# Patient Record
Sex: Male | Born: 1980 | Race: White | Hispanic: No | Marital: Married | Smoking: Never smoker
Health system: Southern US, Community
[De-identification: ages and names within clinical notes are randomized; demographics above are authoritative.]

## PROBLEM LIST (undated history)

## (undated) DIAGNOSIS — I10 Essential (primary) hypertension: Secondary | ICD-10-CM

## (undated) DIAGNOSIS — M5126 Other intervertebral disc displacement, lumbar region: Secondary | ICD-10-CM

## (undated) DIAGNOSIS — L988 Other specified disorders of the skin and subcutaneous tissue: Secondary | ICD-10-CM

## (undated) DIAGNOSIS — E119 Type 2 diabetes mellitus without complications: Secondary | ICD-10-CM

## (undated) DIAGNOSIS — Z973 Presence of spectacles and contact lenses: Secondary | ICD-10-CM

## (undated) DIAGNOSIS — M5430 Sciatica, unspecified side: Secondary | ICD-10-CM

## (undated) DIAGNOSIS — L409 Psoriasis, unspecified: Secondary | ICD-10-CM

## (undated) HISTORY — PX: WISDOM TOOTH EXTRACTION: SHX21

## (undated) HISTORY — PX: ABCESS DRAINAGE: SHX399

---

## 2014-02-10 ENCOUNTER — Ambulatory Visit (INDEPENDENT_AMBULATORY_CARE_PROVIDER_SITE_OTHER): Payer: 59 | Admitting: Physician Assistant

## 2014-02-10 VITALS — BP 130/90 | HR 110 | Temp 99.1°F | Resp 16 | Ht 75.0 in | Wt 316.0 lb

## 2014-02-10 DIAGNOSIS — J029 Acute pharyngitis, unspecified: Secondary | ICD-10-CM

## 2014-02-10 DIAGNOSIS — H938X3 Other specified disorders of ear, bilateral: Secondary | ICD-10-CM

## 2014-02-10 LAB — POCT RAPID STREP A (OFFICE): Rapid Strep A Screen: NEGATIVE

## 2014-02-10 MED ORDER — FLUTICASONE PROPIONATE 50 MCG/ACT NA SUSP: 2.0000 | Freq: Every day | NASAL | Status: DC

## 2014-02-10 NOTE — Progress Notes (Signed)
   Subjective:    Patient ID: Kyle [name], male    DOB: 11/29/1980, 34 y.o.   MRN: [id number]  PCP: No PCP Per Patient  Medications, allergies, past medical history, surgical history, family history, social history and problem list reviewed and updated.  HPI  [age] yom with no significant pmh presents with 3 day h/o sore throat.   Sx initially were sore throat. This has been constant past 3 days. No dysphagia, odynophagia. Has been taking care of one his children who has strep throat. Pt has had strep throat several times in the past and states this feels similar to those episodes. For past 2 days has felt congested. Ears and head feel congested.   Denies any cp, sob, cough, otalgia, abd pain, n/v, diarrhea. Denies itchy/watery eyes. Denies rhinorrhea, sneezing. No fever, chills at home.   Hx seasonal allergies which have been bugging him lately. Typically takes sudafed prn.   Review of Systems See HPI.     Objective:   Physical Exam  Constitutional: He appears well-developed and well-nourished.  Non-toxic appearance. He does not have a sickly appearance. He does not appear ill. No distress.  BP 130/90 mmHg  Pulse 110  Temp(Src) 99.1 F (37.3 C) (Oral)  Resp 16  Ht 6\' 3"  (1.905 m)  Wt 316 lb (143.337 kg)  BMI 39.50 kg/m2  SpO2 97%   HENT:  Right Ear: Tympanic membrane is not erythematous. A middle ear effusion is present.  Left Ear: Tympanic membrane is not erythematous. A middle ear effusion is present.  Nose: Mucosal edema and rhinorrhea present. Right sinus exhibits no maxillary sinus tenderness and no frontal sinus tenderness. Left sinus exhibits no maxillary sinus tenderness and no frontal sinus tenderness.  Mouth/Throat: Uvula is midline. Posterior oropharyngeal erythema present. No oropharyngeal exudate, posterior oropharyngeal edema or tonsillar abscesses.  Mild erythema posterior oropharynx. No exudates.   Pulmonary/Chest: Effort normal and breath sounds normal. He has  no decreased breath sounds. He has no wheezes. He has no rhonchi. He has no rales.  Lymphadenopathy:       Head (right side): Submandibular adenopathy present. No submental and no tonsillar adenopathy present.       Head (left side): Submandibular adenopathy present. No submental and no tonsillar adenopathy present.    He has no cervical adenopathy.   Results for orders placed or performed in visit on 02/10/14  POCT rapid strep A  Result Value Ref Range   Rapid Strep A Screen Negative Negative       Assessment & Plan:   [age] yom with no significant pmh presents with 3 day h/o sore throat.   Sore throat - Plan: POCT rapid strep A, Culture, Group A Strep --strep swab neg, cx sent --mild erythema on exam, no exudates --hr slightly elevated today, unsure of baseline, no fever today --tylenol/lozenges/fluid/rest --rtc 3-4 days if not improving  Ear congestion, bilateral --could be due to allergies --flonase sent, rec claritin  Donnajean Lopesodd M. Doreene Forrey, PA-C Physician Assistant-Certified [facility]  02/10/2014 9:02 AM

## 2014-02-10 NOTE — Patient Instructions (Signed)
Your strep swab was negative. I sent a culture and will be in touch with you if this is positive to send in an antibiotic.  In the meantime, rest, fluids, tylenol will help.  I've sent in flonase if you feel like your allergies are bothering you and that you're congested.  If youre not feeling better in 3-4 days please return for further evaluation.

## 2014-02-13 LAB — CULTURE, GROUP A STREP

## 2014-02-14 ENCOUNTER — Telehealth: Payer: Self-pay | Admitting: Physician Assistant

## 2014-02-14 DIAGNOSIS — J020 Streptococcal pharyngitis: Secondary | ICD-10-CM

## 2014-02-14 MED ORDER — CEFDINIR 300 MG PO CAPS: 300.0000 mg | ORAL_CAPSULE | Freq: Two times a day (BID) | ORAL | Status: DC

## 2014-02-14 NOTE — Telephone Encounter (Signed)
Throat cx came back for GAS. Called pt, he is still having uri sx along with mild-mod sore throat. Wishes to start abx. States he has taken amoxicillin in the past and felt it did not work for him. He would like a diff abx. Starting cefdinir.

## 2014-11-20 ENCOUNTER — Emergency Department (INDEPENDENT_AMBULATORY_CARE_PROVIDER_SITE_OTHER)
Admission: EM | Admit: 2014-11-20 | Discharge: 2014-11-20 | Disposition: A | Discharge status: Other Acute Inpatient Hospital | Payer: 59 | Source: Home / Self Care

## 2014-11-20 ENCOUNTER — Encounter (HOSPITAL_COMMUNITY): Payer: Self-pay | Admitting: *Deleted

## 2014-11-20 ENCOUNTER — Emergency Department (HOSPITAL_COMMUNITY)
Admission: EM | Admit: 2014-11-20 | Discharge: 2014-11-20 | Disposition: A | Discharge status: Home / Self Care | Payer: 59 | Attending: Emergency Medicine | Admitting: Emergency Medicine

## 2014-11-20 ENCOUNTER — Encounter (HOSPITAL_COMMUNITY): Payer: Self-pay | Admitting: Emergency Medicine

## 2014-11-20 DIAGNOSIS — L0501 Pilonidal cyst with abscess: Secondary | ICD-10-CM | POA: Diagnosis not present

## 2014-11-20 DIAGNOSIS — Z7951 Long term (current) use of inhaled steroids: Secondary | ICD-10-CM | POA: Insufficient documentation

## 2014-11-20 DIAGNOSIS — Z792 Long term (current) use of antibiotics: Secondary | ICD-10-CM | POA: Insufficient documentation

## 2014-11-20 MED ORDER — HYDROCODONE-ACETAMINOPHEN 5-325 MG PO TABS: ORAL_TABLET | ORAL | Status: DC

## 2014-11-20 MED ORDER — BUPIVACAINE HCL (PF) 0.5 % IJ SOLN
10.0000 mL | Freq: Once | INTRAMUSCULAR | Status: AC
  Administered 2014-11-20: 10 mL
  Filled 2014-11-20: qty 10

## 2014-11-20 MED ORDER — LIDOCAINE-EPINEPHRINE-TETRACAINE (LET) SOLUTION
3.0000 mL | Freq: Once | NASAL | Status: AC
  Administered 2014-11-20: 3 mL via TOPICAL
  Filled 2014-11-20: qty 3

## 2014-11-20 MED ORDER — SULFAMETHOXAZOLE-TRIMETHOPRIM 800-160 MG PO TABS: 1.0000 | ORAL_TABLET | Freq: Two times a day (BID) | ORAL | Status: DC

## 2014-11-20 MED ORDER — SULFAMETHOXAZOLE-TRIMETHOPRIM 800-160 MG PO TABS
1.0000 | ORAL_TABLET | Freq: Once | ORAL | Status: AC
  Administered 2014-11-20: 1 via ORAL
  Filled 2014-11-20: qty 1

## 2014-11-20 NOTE — Discharge Instructions (Signed)
Take vicodin for breakthrough pain, do not drink alcohol, drive, care for children or do other critical tasks while taking vicodin.  Do not hesitate to return to the emergency room for any new, worsening or concerning symptoms.  Please obtain primary care using resource guide below. Let them know that you were seen in the emergency room and that they will need to obtain records for further outpatient management.   Incision and Drainage of a Pilonidal Cyst, Care After Refer to this sheet in the next few weeks. These instructions provide you with information on caring for yourself after your procedure. Your health care provider may also give you more specific instructions. Your treatment has been planned according to current medical practices, but problems sometimes occur. Call your health care provider if you have any problems or questions after your procedure. WHAT TO EXPECT AFTER THE PROCEDURE After your procedure, it is typical to have the following:  Pain near or at the surgical area.  Blood-tinged discharge on your wound packing or your bandage (dressing). HOME CARE INSTRUCTIONS  Take medicines only as directed by your health care provider.  If you were prescribed an antibiotic medicine, finish it all even if you start to feel better.  To prevent constipation:  Drink enough fluid to keep your urine clear or pale yellow.  Include lots of whole grains, fruits, and vegetables in your diet.  Do not do activities that irritate or put pressure on your buttocks for about 2 weeks or as directed by your health care provider. These include bike riding, running, and anything that involves a twisting motion.  Do not sit for long periods of time.  Sleep on your side instead of your back.  Ask your health care provider when you can return to work and resume your usual activities.  Wear loose, cotton underwear.  Keep all follow-up visits as directed by your health care provider. This is  important. If you had a surgical cut (incision) and drainage with wound packing:  Return to your health care provider as instructed to have your packing changed or removed.  Keep the incision area dry until your packing has been removed.  After the packing has been removed, you can start taking showers or baths.  Clean your buttocks area with soap and water.  Pat the area dry with a soft, clean towel. If you had a marsupialization procedure:  You can start taking showers or baths the day after surgery.  Let the water from the shower or bath moisten your dressing before you remove it.  After your shower or bath, pat your buttocks area dry with a soft, clean towel and replace your dressing.  Ask your health care provider:  When you can stop using a dressing.  When you can start taking showers or baths. If you had a surgical cut (incision) and drainage without packing:  Do not get your incision area wet for about 4 days or as directed by your health care provider.  Ask your health care provider when you can start taking showers or baths.  You may be able to start taking showers or baths after 4 days or as directed by your health care provider.  Go back to your health care provider when it is time for your sutures to be taken out. SEEK MEDICAL CARE IF:  Your incision is bleeding.  You have signs of infection at your incision or around the incision. Watch for:  Drainage.  Redness.  Swelling.  Pain.  There is a bad smell coming from your incision site.  Your pain medicine is not helping.  You have a fever or chills.  You have muscles aches.  You are dizzy.  You feel generally ill.   This information is not intended to replace advice given to you by your health care provider. Make sure you discuss any questions you have with your health care provider.   Document Released: 01/30/2006 Document Revised: 01/20/2014 Document Reviewed: 05/19/2013 Elsevier Interactive  Patient Education 2016 [employer].   Emergency Department Resource Guide 1) Find a Doctor and Pay Out of Pocket Although you won't have to find out who is covered by your insurance plan, it is a good idea to ask around and get recommendations. You will then need to call the office and see if the doctor you have chosen will accept you as a new patient and what types of options they offer for patients who are self-pay. Some doctors offer discounts or will set up payment plans for their patients who do not have insurance, but you will need to ask so you aren't surprised when you get to your appointment.  2) Contact Your Local Health Department Not all health departments have doctors that can see patients for sick visits, but many do, so it is worth a call to see if yours does. If you don't know where your local health department is, you can check in your phone book. The CDC also has a tool to help you locate your state's health department, and many state websites also have listings of all of their local health departments.  3) Find a Walk-in Clinic If your illness is not likely to be very severe or complicated, you may want to try a walk in clinic. These are popping up all over the country in pharmacies, drugstores, and shopping centers. They're usually staffed by nurse practitioners or physician assistants that have been trained to treat common illnesses and complaints. They're usually fairly quick and inexpensive. However, if you have serious medical issues or chronic medical problems, these are probably not your best option.  No Primary Care Doctor: - Call Health Connect at  [phone] - they can help you locate a primary care doctor that  accepts your insurance, provides certain services, etc. - Physician Referral Service- [phone]  Chronic Pain Problems: Organization         Address  Phone   Notes  [facility]  [phone] Patients need to be referred by their  primary care doctor.   Medication Assistance: Organization         Address  Phone   Notes  [facility] Medication [facility] [address] [place] [place], [place] [address] [phone] --Must be a resident of [facility] -- Must have NO insurance coverage whatsoever (no Medicaid/ Medicare, etc.) -- The pt. MUST have a primary care doctor that directs their care regularly and follows them in the community   [facility]  [phone]   [employer]  [phone]    Agencies that provide inexpensive medical care: Organization         Address  Phone   Notes  [name]  [phone]   [name]    [phone]   [facility] [address] [place], [place] [address] [phone]   [facility] [address] [place], [place] [phone]   Planned Parenthood    (  [phone]   [facility]    [phone]   Community [facility] and [facility]  [address], [place] Phone:  [phone], Fax:  [phone] Hours of Operation:  9 am - 6 pm, M-F.  Also accepts Medicaid/Medicare and self-pay.  [facility]  [address], [place] Phone: [phone], Fax: [phone]. Hours of Operation:  8:30 am - 5:30 pm, M-F.  Also accepts Medicaid and self-pay.  [facility] [place] [address] [place] Phone: [phone]   [facility] [address] Natasha Bence [place], [place] [phone], Ext. [phone] Mondays & Thursdays: 7-9 AM.  First 15 patients are seen on a first come, first serve basis.    Medicaid-accepting [facility] Providers:  Organization         Address  Phone   Notes  [facility] [address], [place] [phone] Also accepts self-pay patients.  [facility] [address] Laurell Josephs [place], [place]  [phone]   [facility] [address], [place] [phone]   [facility] [address] [place] [phone]   Renaye Rakers [address], [place]   [phone] Only accepts [place] Access [place] patients after they have their name applied to their card.   Self-Pay (no insurance) in [facility]:  Organization         Address  Phone   Notes  Sickle Cell Patients, [facility] Internal Medicine [address] [place], [place] [phone]   [facility] [address] [place], [place] [phone]   [name] [place]  [address],  [phone]   [facility]/Dr. Osei-Bonsu  [address] [place] or [address], [place] [phone] Phone number for both [place] and [place] locations is the same.  [facility] [address] [place] [phone]   [facility] [address] [place] or [address] [phone]   [facility] [address], [place] [phone], phone; [phone], fax Sees patients 1st and 3rd Saturday of every month.  Must not qualify for public or private insurance (i.e. Medicaid, Medicare, [place] Health Choice, Veterans' Benefits)  Household income should be no more than 200% of the poverty level The clinic cannot treat you if you are pregnant or think you are pregnant  Sexually transmitted diseases are not treated at the clinic.    Dental Care: Organization         Address  Phone  Notes  [facility] Department of [facility] [address] [place], [place] [phone] Accepts children up to age 40 who are enrolled in [place] or [place] Health Choice; pregnant women with a Medicaid card; and children who have applied for Medicaid or [place] Health Choice, but were declined, whose parents can pay a reduced fee  at time of service.  [facility]  [address], [place] [phone] Accepts children up to age 73 who are enrolled in [place] or [place] Health Choice; pregnant women with a Medicaid card; and children who have applied for Medicaid or [place] Health Choice, but were declined, whose parents can pay a  reduced fee at time of service.  [facility]  [address] [place], [place] [phone] Patients are seen by appointment only. Walk-ins are not accepted. [facility] will see patients 37 years of age and older. Monday - Tuesday (8am-5pm) Most Wednesdays (8:30-5pm) $30 per visit, cash only  [facility] Adult Dental Access PROGRAM  [address], [facility] [phone] Patients are seen by appointment only. Walk-ins are not accepted. [facility] will see patients 66 years of age and older. One Wednesday Evening (Monthly: Volunteer Based).  $30 per visit, cash only  [employer]  [phone] for adults; Children under age 50, call Graduate Pediatric Dentistry at [phone]. Children aged 37-14, please call [phone] to request a pediatric application.  Dental services are provided in all areas of dental care including fillings, crowns and bridges, complete and partial dentures, implants, gum treatment, root canals, and extractions. Preventive care is also provided. Treatment is provided to both adults and children. Patients are selected via a lottery and there is often a waiting list.   [facility] [address] [place]  [phone] [email or web address]   [facility] [address] [place], [place] [phone], Ext. [phone] Second and Fourth Thursday of each month, opens at 6:30 AM; Clinic ends at 9 AM.  Patients are seen on a first-come first-served basis, and a limited number are seen during each clinic.   [facility]  [address] Ether Griffins [place], [place] [phone]   Eligibility Requirements You must have lived in [place], [place], or [place] counties for at least the last three months.   You cannot be eligible for state or federal sponsored [place], including [employer], [place], or [employer].   You generally cannot be eligible for healthcare insurance through your employer.    How to apply: Eligibility screenings are held every Tuesday and Wednesday afternoon from 1:00 pm until 4:00 pm. You do not need an appointment for the interview!  [facility] [address] [place], [place] [phone]   [facility] Health Department  [phone]   [facility] Health Department  [phone]   [facility] Health Department  [phone]    Behavioral Health Resources in the Community: Intensive Outpatient Programs Organization         Address  Phone  Notes  [facility] Services [address] [place], [place] [phone]   [facility] Outpatient [address] [place], [place] [phone]   [facility]: [facility] [address], [place], [place]  [phone]   [facility] Mental Health [address]  [place], [place] [phone] or [phone]   Substance Abuse Resources Organization         Address  Phone  Notes  [facility] Drug Services  [phone]   [facility]  [phone]   [facility]  [phone]   Floydene Flock  [phone]   Residential & Outpatient Substance Abuse Program  [phone]   Psychological Services Organization         Address  Phone  Notes  [facility] Behavioral Health  [phone]   [facility] Services  [phone]   [facility] Mental Health [address] [place] [phone] or [phone]    Mobile Crisis Teams Organization         Address  Phone  Notes  [facility], Mobile Crisis Care Unit  [phone]  [facility] Psych[facility]  Services  [address] [place], [place] [phone]   [facility] [address] [place] [phone]    Self-Help/Support Groups Organization         Address  Phone             Notes  [facility] [place] - variety of support groups  [phone]- [phone] Call for more information  Narcotics Anonymous (NA), [facility]aring [facility] [address], [employer] [place]  2 meetings at this location   Statistician         Address  Phone  Notes  [facility] [address],    [place]  [phone]   [facility]  [address] [place] [address], [place], [place] [phone]   [facility] Treatment Facility [address] [place], [place] [phone] Admissions: 8am-3pm M-F  [facility] [address],    [place], [place] [phone]   [facility] [address] [place], [place], [place] [phone]   [facility] [address],  [place], [place] [phone]   [facility] - Intensive Outpatient [address] Laurell Josephs [address], [place], [place] [phone]   [facility] (Addiction [facility] [address]  [place], [place] [phone] or [phone]   [facility] ([facility]) [address] [place], [place] [phone] Accepts Medicaid  [facility] [address],  [place] [phone] Substance Abuse/Addiction Treatment   [facility] Organization         Address  Phone  Notes  [facility]  [phone]   Angie Fava, PhD [address] Ervin Knack [place], [place]   [phone] or [phone]   [facility]   [address] [place], [place] [phone]   [facility] [address] [place], [place] [phone] Insurance/Medicaid/sponsorship through [facility] and Families [address]                                    [place], [place] [phone] Therapy/tele-psych/case  [facility] [address], [place] [phone]    Dr. Lolly Mustache  [phone]   [facility]  [employer] [facility] Dept. 1) [address] [place] 2) [address] [place] 3)  [address], [place] [phone]  [phone]   [facility] Child Abuse Hotline ([phone] or [phone] (After Hours)

## 2014-11-20 NOTE — ED Notes (Signed)
The patient was sent here from [facility] for a Polynodal cyst.   UC advised he would need further evaluation by the ED.

## 2014-11-20 NOTE — ED Provider Notes (Signed)
CSN: [id number]     Arrival date & time 11/20/14  2054 History  By signing my name below, I, Kyle [name], attest that this documentation has been prepared under the direction and in the presence of [employer], PA-C. Electronically Signed: Ronney [name], ED Scribe. 11/20/2014. 10:06 PM.    Chief Complaint  Patient presents with  . Abscess   The history is provided by the patient. No language interpreter was used.   HPI Comments: Kyle [name] is a 34 y.o. male who presents to the Emergency Department complaining of a painful growth in his sacrum consistent with a pilonidal cyst that onset 5 days ago. Patient states he was seen at [facility] for this PTA and was sent here for drainage. He states he has never seen a Careers adviser. He denies a history of DM. He denies pain with bowel movements, fever, chills, nausea, or vomiting.   History reviewed. No pertinent past medical history. History reviewed. No pertinent past surgical history. Family History  Problem Relation Age of Onset  . Diabetes Father   . Hypertension Father   . Inflammatory bowel disease Sister    Social History  Substance Use Topics  . Smoking status: Never Smoker   . Smokeless tobacco: None  . Alcohol Use: No    Review of Systems A complete 10 system review of systems was obtained and all systems are negative except as noted in the HPI and PMH.    Allergies  Review of patient's allergies indicates no known allergies.  Home Medications   Prior to Admission medications   Medication Sig Start Date End Date Taking? Authorizing Provider  cefdinir (OMNICEF) 300 MG capsule Take 1 capsule (300 mg total) by mouth 2 (two) times daily. 02/14/14   Todd McVeigh, PA  fluticasone (FLONASE) 50 MCG/ACT nasal spray Place 2 sprays into both nostrils daily. 02/10/14   Todd McVeigh, PA   BP 149/107 mmHg  Pulse 91  Temp(Src) 98.1 F (36.7 C) (Oral)  Resp 22  Ht  (1.905 m)  Wt 319 lb (144.697 kg)  BMI 39.87 kg/m2  SpO2 100% Physical  Exam  Constitutional: He is oriented to person, place, and time. He appears well-developed and well-nourished. No distress.  HENT:  Head: Normocephalic and atraumatic.  Eyes: Conjunctivae and EOM are normal.  Neck: Neck supple. No tracheal deviation present.  Cardiovascular: Normal rate.   Pulmonary/Chest: Effort normal. No respiratory distress.  Musculoskeletal: Normal range of motion.  Neurological: He is alert and oriented to person, place, and time.  Skin: Skin is warm and dry.     Psychiatric: He has a normal mood and affect. His behavior is normal.  Nursing note and vitals reviewed.   ED Course  .Marland KitchenIncision and Drainage Date/Time: 11/20/2014 11:46 PM Performed by: Wynetta Emery Authorized by: Wynetta Emery Consent: Verbal consent obtained. Consent given by: patient Type: pilonidal cyst Body area: anogenital Location details: pilonidal Anesthesia: local infiltration Local anesthetic: bupivacaine 0.5% without epinephrine and LET (lido,epi,tetracaine) Anesthetic total: 3 ml Patient sedated: no Scalpel size: 11 Incision type: single straight Incision depth: dermal Complexity: complex Drainage: bloody Drainage amount: moderate Wound treatment: wound left open Packing material: none Patient tolerance: Patient tolerated the procedure well with no immediate complications   (including critical care time)  DIAGNOSTIC STUDIES: Oxygen Saturation is 100% on RA, normal by my interpretation.    COORDINATION OF CARE: 10:00 PM - Discussed treatment plan with pt at bedside which includes I&D. Pt verbalized understanding and agreed to plan.  Labs Review Labs Reviewed - No data to display  Imaging Review No results found. I have personally reviewed and evaluated these images and lab results as part of my medical decision-making.   EKG Interpretation None       MDM   Final diagnoses:  Pilonidal abscess    Filed Vitals:   11/20/14 2110 11/20/14 2319  BP:  149/107 131/101  Pulse: 91 99  Temp: 98.1 F (36.7 C)   TempSrc: Oral   Resp: 22 14  Height: 6\' 3"  (1.905 m)   Weight: 319 lb (144.697 kg)   SpO2: 100% 100%    Medications  lidocaine-EPINEPHrine-tetracaine (LET) solution (3 mLs Topical Given 11/20/14 2224)  bupivacaine (MARCAINE) 0.5 % injection 10 mL (10 mLs Infiltration Given 11/20/14 2225)  sulfamethoxazole-trimethoprim (BACTRIM DS,SEPTRA DS) 800-160 MG per tablet 1 tablet (1 tablet Oral Given 11/20/14 2317)    Steward Rosndrew Gowan is 34 y.o. male presenting with sent from urgent care for evaluation of pilonidal abscess. As per urgent care notes the abscess appears to extend perirectally however this is not the case on my exam. I and D is performed without complication. Patient has no pain with defecation. Patient started on Bactrim and he's given a general surgery referral.   Evaluation does not show pathology that would require ongoing emergent intervention or inpatient treatment. Pt is hemodynamically stable and mentating appropriately. Discussed findings and plan with patient/guardian, who agrees with care plan. All questions answered. Return precautions discussed and outpatient follow up given.   Discharge Medication List as of 11/20/2014 10:58 PM    START taking these medications   Details  HYDROcodone-acetaminophen (NORCO/VICODIN) 5-325 MG tablet Take 1-2 tablets by mouth every 6 hours as needed for pain and/or cough., Print    sulfamethoxazole-trimethoprim (BACTRIM DS) 800-160 MG tablet Take 1 tablet by mouth 2 (two) times daily., Starting 11/20/2014, Until Discontinued, Print         I personally performed the services described in this documentation, which was scribed in my presence. The recorded information has been reviewed and is accurate.     Wynetta Emeryicole Shlomie Romig, PA-C 11/20/14 2348  Raeford RazorStephen Kohut, MD 11/23/14 2155

## 2014-11-20 NOTE — ED Provider Notes (Addendum)
CSN: [id number]     Arrival date & time 11/20/14  1902 History   None    Chief Complaint  Patient presents with  . Recurrent Skin Infections   (Consider location/radiation/quality/duration/timing/severity/associated sxs/prior Treatment) Patient is a 34 y.o. male presenting with abscess. The history is provided by the patient.  Abscess Location:  Ano-genital Ano-genital abscess location:  Gluteal cleft Abscess quality: fluctuance, induration, painful, redness and warmth   Red streaking: no   Duration:  5 days Progression:  Worsening Pain details:    Severity:  Moderate Chronicity:  Chronic Associated symptoms: no fever   Risk factors: family hx of MRSA and prior abscess     History reviewed. No pertinent past medical history. History reviewed. No pertinent past surgical history. Family History  Problem Relation Age of Onset  . Diabetes Father   . Hypertension Father   . Inflammatory bowel disease Sister    Social History  Substance Use Topics  . Smoking status: Never Smoker   . Smokeless tobacco: None  . Alcohol Use: No    Review of Systems  Constitutional: Negative.  Negative for fever.  Gastrointestinal: Positive for rectal pain. Negative for abdominal pain.  All other systems reviewed and are negative.   Allergies  Review of patient's allergies indicates no known allergies.  Home Medications   Prior to Admission medications   Medication Sig Start Date End Date Taking? Authorizing Provider  cefdinir (OMNICEF) 300 MG capsule Take 1 capsule (300 mg total) by mouth 2 (two) times daily. 02/14/14   Todd McVeigh, PA  fluticasone (FLONASE) 50 MCG/ACT nasal spray Place 2 sprays into both nostrils daily. 02/10/14   Raelyn Ensignodd McVeigh, PA   Meds Ordered and Administered this Visit  Medications - No data to display  BP 138/92 mmHg  Pulse 78  Temp(Src) 98.6 F (37 C) (Oral)  Resp 18  SpO2 100% No data found.   Physical Exam  Constitutional: He appears well-developed and  well-nourished.  Genitourinary:     Skin: Skin is warm and dry.  Nursing note and vitals reviewed.   ED Course  Procedures (including critical care time)  Labs Review Labs Reviewed - No data to display  Imaging Review No results found.   Visual Acuity Review  Right Eye Distance:   Left Eye Distance:   Bilateral Distance:    Right Eye Near:   Left Eye Near:    Bilateral Near:         MDM   1. Sacrococcygeal pilonidal cyst with abscess    Sent for drainage of pilonidal abscess.    Linna HoffJames D Kindl, MD 11/20/14 [id number]  Linna HoffJames D Kindl, MD 11/20/14 [phone]

## 2014-11-20 NOTE — ED Notes (Signed)
Pt has  A  Boil  lower  Sacral  Area   Near  Top of  Buttocks    Has  Had  A  pilonadol  Cyst in the  Past        Flared up  About 4  Days  Ago

## 2014-11-20 NOTE — ED Notes (Signed)
Pt. reports multiple abscesses at buttocks with drainage onset this week , denies fever .

## 2015-01-14 DIAGNOSIS — M5430 Sciatica, unspecified side: Secondary | ICD-10-CM

## 2015-01-14 HISTORY — DX: Sciatica, unspecified side: M54.30

## 2015-03-08 ENCOUNTER — Ambulatory Visit (INDEPENDENT_AMBULATORY_CARE_PROVIDER_SITE_OTHER): Payer: 59 | Admitting: Family Medicine

## 2015-03-08 VITALS — BP 128/90 | HR 92 | Temp 98.4°F | Resp 18 | Wt 325.4 lb

## 2015-03-08 DIAGNOSIS — M5432 Sciatica, left side: Secondary | ICD-10-CM | POA: Diagnosis not present

## 2015-03-08 MED ORDER — OXYCODONE-ACETAMINOPHEN 7.5-325 MG PO TABS: 1.0000 | ORAL_TABLET | ORAL | Status: DC | PRN

## 2015-03-08 MED ORDER — PREDNISONE 20 MG PO TABS: ORAL_TABLET | ORAL | Status: DC

## 2015-03-08 NOTE — Patient Instructions (Signed)

## 2015-03-08 NOTE — Progress Notes (Signed)
This is a 35 y.o.male who complains of left sciatica and LBP.  Character of pain: sharp Location of pain:  Left leg Radiation of pain:  Down all the way to pinkie toe Onset associated with:  nothing Patient has a past history of low back pain for which he recovered 3 years ago  Brayden Brodhead denies any urinary symptoms, bowel problems, numbness in the legs, loss of motor power. Steward Ros had no fever.  Jarry Manon has tried OTC NSAIDS.  History reviewed. No pertinent past medical history.   History reviewed. No pertinent past surgical history.  Objective: overweight middle-aged male in no acute distress. Blood pressure 128/90, pulse 92, temperature 98.4 F (36.9 C), temperature source Oral, resp. rate 18, weight 325 lb 6.4 oz (147.6 kg), SpO2 98 %.Body mass index is 40.67 kg/(m^2). Palpation of the back reveals no pain CVA:  nontender Abdomen: soft Peripheral pulses:  DP/PT present Inspection of the back: Reveals no scoliosis Straight-leg raising: positive on left Motor exam of lower extremity: No abnormal weakness. Reflexes: Symmetric and normal Skin exam: normal  Assessment/Plan: Acute lower back pain without acute neurological findings.    ICD-9-CM ICD-10-CM   1. Sciatica of left side 724.3 M54.32 predniSONE (DELTASONE) 20 MG tablet     oxyCODONE-acetaminophen (PERCOCET) 7.5-325 MG tablet

## 2015-03-14 ENCOUNTER — Telehealth: Payer: Self-pay

## 2015-03-14 DIAGNOSIS — M5431 Sciatica, right side: Secondary | ICD-10-CM

## 2015-03-14 DIAGNOSIS — M5432 Sciatica, left side: Principal | ICD-10-CM

## 2015-03-14 NOTE — Telephone Encounter (Signed)
Ortho

## 2015-03-14 NOTE — Telephone Encounter (Signed)
The patient called to ask about what the next step would be for his back problems.  He said the medication that Dr Milus Glazier gave is helping to reduce the pain, but he is still experiencing numbness down his legs.  Please advise, thank you.  CB#: [phone]

## 2015-10-16 ENCOUNTER — Ambulatory Visit (INDEPENDENT_AMBULATORY_CARE_PROVIDER_SITE_OTHER): Payer: 59 | Admitting: Orthopaedic Surgery

## 2015-10-16 DIAGNOSIS — M545 Low back pain: Secondary | ICD-10-CM | POA: Diagnosis not present

## 2015-10-22 ENCOUNTER — Ambulatory Visit (INDEPENDENT_AMBULATORY_CARE_PROVIDER_SITE_OTHER): Payer: 59 | Admitting: Sports Medicine

## 2015-10-22 DIAGNOSIS — I10 Essential (primary) hypertension: Secondary | ICD-10-CM | POA: Diagnosis not present

## 2015-10-22 DIAGNOSIS — R7303 Prediabetes: Secondary | ICD-10-CM

## 2015-10-22 DIAGNOSIS — E6609 Other obesity due to excess calories: Secondary | ICD-10-CM | POA: Diagnosis not present

## 2015-10-30 ENCOUNTER — Ambulatory Visit (INDEPENDENT_AMBULATORY_CARE_PROVIDER_SITE_OTHER): Payer: 59 | Admitting: Orthopaedic Surgery

## 2015-11-02 LAB — BASIC METABOLIC PANEL: GLUCOSE: 126 mg/dL

## 2015-11-02 LAB — LIPID PANEL
HDL: 28 mg/dL — AB (ref 35–70)
LDL Cholesterol: 111 mg/dL
Triglycerides: 113 mg/dL (ref 40–160)

## 2015-11-06 ENCOUNTER — Encounter (INDEPENDENT_AMBULATORY_CARE_PROVIDER_SITE_OTHER): Payer: Self-pay

## 2015-11-19 ENCOUNTER — Ambulatory Visit (INDEPENDENT_AMBULATORY_CARE_PROVIDER_SITE_OTHER): Payer: 59 | Admitting: Sports Medicine

## 2015-11-19 ENCOUNTER — Encounter (INDEPENDENT_AMBULATORY_CARE_PROVIDER_SITE_OTHER): Payer: Self-pay | Admitting: Sports Medicine

## 2015-11-19 ENCOUNTER — Other Ambulatory Visit (INDEPENDENT_AMBULATORY_CARE_PROVIDER_SITE_OTHER): Payer: Self-pay

## 2015-11-19 VITALS — BP 122/94 | HR 83 | Ht 75.0 in | Wt 320.0 lb

## 2015-11-19 DIAGNOSIS — L0591 Pilonidal cyst without abscess: Secondary | ICD-10-CM

## 2015-11-19 DIAGNOSIS — L988 Other specified disorders of the skin and subcutaneous tissue: Secondary | ICD-10-CM | POA: Insufficient documentation

## 2015-11-19 DIAGNOSIS — R739 Hyperglycemia, unspecified: Secondary | ICD-10-CM | POA: Insufficient documentation

## 2015-11-19 DIAGNOSIS — I10 Essential (primary) hypertension: Secondary | ICD-10-CM | POA: Insufficient documentation

## 2015-11-19 DIAGNOSIS — I152 Hypertension secondary to endocrine disorders: Secondary | ICD-10-CM | POA: Insufficient documentation

## 2015-11-19 MED ORDER — LISINOPRIL-HYDROCHLOROTHIAZIDE 20-12.5 MG PO TABS: 1.0000 | ORAL_TABLET | Freq: Every day | ORAL | 1 refills | Status: DC

## 2015-11-19 NOTE — Patient Instructions (Signed)
I am transferring practices as of January 1st  to [facility] at [place].  This is a great opportunity & I am saddened to be leaving [facility] however & excited for new opportunities. I will continue to be seeing patients at [facility] through the end of December. I am happy to see you at the new location but also am confident that you are in great hands with the excellent providers here at [facility].  We are not currently scheduling patients at the new location at this time but if you look on [facility]'s website a contact information should be available there closer to January. Additionally [email or web address] will have information when it becomes available.   

## 2015-11-19 NOTE — Progress Notes (Signed)
Kyle [name] - 35 y.o. male MRN [id number]  Date of birth: 04/21/1980  Office Visit Note: Visit Date: 11/19/2015 PCP: No PCP Per Patient Referred by: No ref. provider found  Subjective: Chief Complaint  Patient presents with  . Follow-up    Blood Pressure recheck   HPI: Patient states going great.  States blood pressure is down.   Pt denies chest pain, palpitations, shortness of breath/DOE, orthopnea/PND, LE swelling.   Patient denies any facial asymmetry, unilateral weakness, or dysarthria.  Patient also reports having painful area across the upper aspect of the sacrum. This is been present for quite some time. He has had have it drained previously. Denies any fevers, chills or significant pain out of proportion this time but has become so in the past.    ROS Otherwise per HPI.  Assessment & Plan: Visit Diagnoses:  1. HTN, goal below 140/90   2. Pilonidal cyst without infection   3. Blood glucose elevated     Plan: Findings:  Hypertension is slightly better. Still slightly high but patient reports home blood pressures have been significantly better. We'll go ahead & keep his blood pressure medicines the same & continue working on lifestyle modifications. Will follow-up with him 3 months for recheck as well as for a hemoglobin A1c checked given the slightly elevated fasting blood glucose. I am happy to follow up with this patient at my new location [facility] for their chronic ongoing issues.    I am going [facility]    Meds & Orders: No orders of the defined types were placed in this encounter.   Orders Placed This Encounter  Procedures  . Ambulatory referral to General Surgery    Follow-up: Return in about 3 months (around 02/19/2016) for at new location. will call to schedule.   Procedures: No procedures performed  No notes on file   Clinical  History: No specialty comments available.  He reports that he has never smoked. He does not have any smokeless tobacco history on file. No results for input(s): HGBA1C, LABURIC in the last 8760 hours.  Objective:  VS:  HT:6\' 3"  (190.5 cm)   WT:(!) 320 lb (145.2 kg)  BMI:40.1    BP:(!) 122/94  HR:83bpm  TEMP: ( )  RESP:   Vitals:   11/19/15 0824 11/19/15 0835 11/19/15 0836  BP: (!) 141/96 122/90 (!) 122/94  Pulse: 83    Weight: (!) 320 lb (145.2 kg)    Height: 6\' 3"  (1.905 m)      Physical Exam  Constitutional: He appears well-developed and well-nourished. No distress.  HENT:  Head: Normocephalic and atraumatic.  Pulmonary/Chest: Effort normal. No respiratory distress.  Neurological: He is alert.  Appropriately interactive.  Skin: Skin is warm and dry. No rash noted. He is not diaphoretic. No erythema. No pallor.     Psychiatric: He has a normal mood and affect. His behavior is normal. Judgment and thought content normal.    Ortho Exam Imaging: No results found.  Past Medical/Family/Surgical/Social History: Medications & Allergies reviewed per EMR Patient Active Problem List   Diagnosis Date Noted  . HTN, goal below 140/90 11/19/2015  . 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 Blood glucose elevated 11/19/2015   No past medical history on file. Family History  Problem Relation Age of Onset  . Diabetes Father   . Hypertension Father   . Inflammatory  bowel disease Sister    No past surgical history on file. Social History   Occupational History  . Not on file.   Social History Main Topics  . Smoking status: Never Smoker  . Smokeless tobacco: Not on file  . Alcohol use No  . Drug use: No  . Sexual activity: Not on file

## 2015-11-19 NOTE — Telephone Encounter (Signed)
Rx refill for Lisinopril-HCTZ

## 2015-11-29 ENCOUNTER — Other Ambulatory Visit (INDEPENDENT_AMBULATORY_CARE_PROVIDER_SITE_OTHER): Payer: Self-pay

## 2015-11-29 MED ORDER — LISINOPRIL-HYDROCHLOROTHIAZIDE 20-12.5 MG PO TABS: 1.0000 | ORAL_TABLET | Freq: Every day | ORAL | 1 refills | Status: DC

## 2015-11-29 NOTE — Telephone Encounter (Signed)
90 day supply for Lisinopril-HCTZ 20-12.5 mg TAB

## 2015-12-11 ENCOUNTER — Ambulatory Visit: Payer: Self-pay | Admitting: Surgery

## 2015-12-11 NOTE — H&P (Signed)
Kyle [name] 12/11/2015 10:38 AM Location: [facility] Patient #: [id number] DOB: 03/31/1980 Married / Language: English / Race: White Male  Patient Care Team: Andrena MewsMichael D Rigby, DO as PCP - General (Family Medicine) Andrena MewsMichael D Rigby, DO as Consulting Physician (Family Medicine) Karie SodaSteven Amarea Macdowell, MD as Consulting Physician (General Surgery)  History of Present Illness Kyle [name](Orley Lawry C. Marcelia Petersen MD; 12/11/2015 12:32 PM) The patient is a 35 year old male who presents with a pilonidal cyst. Note for "Pilonidal cyst": Patient sent for surgical consultation at the request of Gaspar BiddingMichael Rigby with [facility]. Concern for probable pilonidal disease.  Pleasant big active male. 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 He has had infections on his tailbone for over 15 years. He is often had to have incision and drainage done. Sometimes even packed. Never completely resolved. He had another episode last year that required incision and drainage in the emergency room. Often needs oral antibiotics with it. He is now noticed a lump back there. Mentioned to his primary care physician. Surgical consultation recommended.  Patient does not smoke. No problems with hemorrhoids or other issues. He may have had a slightly elevated glucose but no true diagnosis of diabetes. He can walk several miles without difficulty. Usually moves his bowels once or twice a day. No personal nor family history of GI/colon cancer, inflammatory bowel disease, irritable bowel syndrome, allergy such as Celiac Sprue, dietary/dairy problems, colitis, ulcers nor gastritis. No recent sick contacts/gastroenteritis. No travel outside the country. No changes in diet. No dysphagia to solids or liquids. No significant heartburn or reflux. No hematochezia, hematemesis, coffee ground emesis. No evidence of prior gastric/peptic ulceration.   Other Problems Kyle [name](Armen Glenn, CMA; 12/11/2015 10:38 AM) High blood  pressure  Past Surgical History Kyle [name](Armen Glenn, CMA; 12/11/2015 10:38 AM) Oral Surgery  Allergies Kyle [name](Armen Glenn, CMA; 12/11/2015 10:39 AM) No Known Drug Allergies 12/11/2015  Medication History Kyle [name](Armen Glenn, CMA; 12/11/2015 10:40 AM) Lisinopril-Hydrochlorothiazide (20-12.5MG  Tablet, Oral) Active. Acetaminophen (500MG  Tablet, Oral) Active. Medications Reconciled  Social History Kyle [name](Armen Glenn, CMA; 12/11/2015 10:38 AM) Alcohol use Occasional alcohol use. Caffeine use Coffee, Tea. No drug use Tobacco use Former smoker.  Family History Kyle [name](Armen Glenn, CMA; 12/11/2015 10:38 AM) Diabetes Mellitus Father. Hypertension Father. Melanoma Father.     Review of Systems (Armen Sherrine MaplesGlenn CMA; 12/11/2015 10:38 AM) General Not Present- Appetite Loss, Chills, Fatigue, Fever, Night Sweats, Weight Gain and Weight Loss. Skin Present- New Lesions. Not Present- Change in Wart/Mole, Dryness, Hives, Jaundice, Non-Healing Wounds, Rash and Ulcer. HEENT Not Present- Earache, Hearing Loss, Hoarseness, Nose Bleed, Oral Ulcers, Ringing in the Ears, Seasonal Allergies, Sinus Pain, Sore Throat, Visual Disturbances, Wears glasses/contact lenses and Yellow Eyes. Respiratory Not Present- Bloody sputum, Chronic Cough, Difficulty Breathing, Snoring and Wheezing. Breast Not Present- Breast Mass, Breast Pain, Nipple Discharge and Skin Changes. Cardiovascular Not Present- Chest Pain, Difficulty Breathing Lying Down, Leg Cramps, Palpitations, Rapid Heart Rate, Shortness of Breath and Swelling of Extremities. Gastrointestinal Not Present- Abdominal Pain, Bloating, Bloody Stool, Change in Bowel Habits, Chronic diarrhea, Constipation, Difficulty Swallowing, Excessive gas, Gets full quickly at meals, Hemorrhoids, Indigestion, Nausea, Rectal Pain and Vomiting. Male Genitourinary Not Present- Blood in Urine, Change in Urinary Stream, Frequency, Impotence, Nocturia, Painful Urination, Urgency and Urine  Leakage. Musculoskeletal Not Present- Back Pain, Joint Pain, Joint Stiffness, Muscle Pain, Muscle Weakness and Swelling of Extremities. Neurological Not Present- Decreased Memory, Fainting, Headaches, Numbness, Seizures, Tingling, Tremor, Trouble walking and Weakness. Psychiatric Not Present- Anxiety, Bipolar, Change in Sleep Pattern, Depression, Fearful and Frequent  crying. Endocrine Not Present- Cold Intolerance, Excessive Hunger, Hair Changes, Heat Intolerance, Hot flashes and New Diabetes. Hematology Not Present- Blood Thinners, Easy Bruising, Excessive bleeding, Gland problems, HIV and Persistent Infections.  Vitals (Armen Glenn CMA; 12/11/2015 10:39 AM) 12/11/2015 10:38 AM Weight: 321.13 lb Height: 75in Body Surface Area: 2.68 m Body Mass Index: 40.14 kg/m  Temp.: 98.94F  Pulse: 87 (Regular)  P.OX: 97% (Room air) BP: 118/92 (Sitting, Left Arm, Standard)      Physical Exam Kyle Sportsman MD; 12/11/2015 10:58 AM)  General Mental Status-Alert. General Appearance-Not in acute distress, Not Sickly. Orientation-Oriented X3. Hydration-Well hydrated. Voice-Normal.  Integumentary Global Assessment Upon inspection and palpation of skin surfaces of the - Axillae: non-tender, no inflammation or ulceration, no drainage. and Distribution of scalp and body hair is normal. General Characteristics Temperature - normal warmth is noted.  Head and Neck Head-normocephalic, atraumatic with no lesions or palpable masses. Face Global Assessment - atraumatic, no absence of expression. Neck Global Assessment - no abnormal movements, no bruit auscultated on the right, no bruit auscultated on the left, no decreased range of motion, non-tender. Trachea-midline. Thyroid Gland Characteristics - non-tender.  Eye Eyeball - Left-Extraocular movements intact, No Nystagmus. Eyeball - Right-Extraocular movements intact, No Nystagmus. Cornea - Left-No Hazy. Cornea -  Right-No Hazy. Sclera/Conjunctiva - Left-No scleral icterus, No Discharge. Sclera/Conjunctiva - Right-No scleral icterus, No Discharge. Pupil - Left-Direct reaction to light normal. Pupil - Right-Direct reaction to light normal.  ENMT Ears Pinna - Left - no drainage observed, no generalized tenderness observed. Right - no drainage observed, no generalized tenderness observed. Nose and Sinuses External Inspection of the Nose - no destructive lesion observed. Inspection of the nares - Left - quiet respiration. Right - quiet respiration. Mouth and Throat Lips - Upper Lip - no fissures observed, no pallor noted. Lower Lip - no fissures observed, no pallor noted. Nasopharynx - no discharge present. Oral Cavity/Oropharynx - Tongue - no dryness observed. Oral Mucosa - no cyanosis observed. Hypopharynx - no evidence of airway distress observed.  Chest and Lung Exam Inspection Movements - Normal and Symmetrical. Accessory muscles - No use of accessory muscles in breathing. Palpation Palpation of the chest reveals - Non-tender. Auscultation Breath sounds - Normal and Clear.  Cardiovascular Auscultation Rhythm - Regular. Murmurs & Other Heart Sounds - Auscultation of the heart reveals - No Murmurs and No Systolic Clicks.  Abdomen Inspection Inspection of the abdomen reveals - No Visible peristalsis and No Abnormal pulsations. Umbilicus - No Bleeding, No Urine drainage. Palpation/Percussion Palpation and Percussion of the abdomen reveal - Soft, Non Tender, No Rebound tenderness, No Rigidity (guarding) and No Cutaneous hyperesthesia. Note: Abdomen obese but soft. No diastases recti. Nontender, nondistended. No guarding. No umbilical no other hernias  Male Genitourinary Sexual Maturity Tanner 5 - Adult hair pattern and Adult penile size and shape. Note: No inguinal hernias. Normal external genitalia. Epididymi, testes, and spermatic cords normal without any masses.  Rectal Note:  2 x 1 cm purple hypertrophic nodule at right apex of intergluteal cleft consistent with probable chronic cystic granulation tissue. A few pits in the midline. Consistent with chronic pilonidal disease. No active cellulitis or abscess.  Perianal skin clean with good hygiene. No pruritis ani. No fissure. No abscess/fistula. Normal sphincter tone. No external hemorrhoids.  Peripheral Vascular Upper Extremity Inspection - Left - No Cyanotic nailbeds, Not Ischemic. Right - No Cyanotic nailbeds, Not Ischemic.  Neurologic Neurologic evaluation reveals -normal attention span and ability to concentrate, able to name  objects and repeat phrases. Appropriate fund of knowledge , normal sensation and normal coordination. Mental Status Affect - not angry, not paranoid. Cranial Nerves-Normal Bilaterally. Gait-Normal.  Neuropsychiatric Mental status exam performed with findings of-able to articulate well with normal speech/language, rate, volume and coherence, thought content normal with ability to perform basic computations and apply abstract reasoning and no evidence of hallucinations, delusions, obsessions or homicidal/suicidal ideation.  Musculoskeletal Global Assessment Spine, Ribs and Pelvis - no instability, subluxation or laxity. Right Upper Extremity - no instability, subluxation or laxity.  Lymphatic Head & Neck  General Head & Neck Lymphatics: Bilateral - Description - No Localized lymphadenopathy. Axillary  General Axillary Region: Bilateral - Description - No Localized lymphadenopathy. Femoral & Inguinal  Generalized Femoral & Inguinal Lymphatics: Left - Description - No Localized lymphadenopathy. Right - Description - No Localized lymphadenopathy.    Assessment & Plan Kyle Sportsman MD; 12/11/2015 12:32 PM)  PILONIDAL CYST WITHOUT INFECTION (L05.91) Impression: Very classic history and physical for pilonidal disease. He does have a hypertrophic nodule. It doesn't look  like a skin cancer but nonetheless I would definitely get rid of that region.  He's interested in proceeding. We'll work to coordinate a convenient time   The anatomy of the gluteal and sacral region was discussed. Pathophysiology of pilonidal disease due to ingrown hairs was discussed. Importance of good hygiene discussed. Importance of keeping hairs trimmed in the intergluteal crease from anus to top of sacrum/tailbone spine noted. Need for good hygiene stressed. Use of electric razor or nose hair trimmers to keep the hairs trimmed discussed.  Risk of worsening progression needing surgical drainage of abscess or perhaps excision of chronic pilonidal disease with skin flap closure over drains discussed. Possible need for her to leave it open with packing to allow the wound close over several weeks/months discussed. Risks benefits alternatives to surgery discussed as well. Risk of recurrent disease discussed. Patient was expressed understanding. Literature given to patient. We will see if we can control this without an operation first.  Current Plans You are being scheduled for surgery - Our schedulers will call you.  You should hear from our office's scheduling department within 5 working days about the location, date, and time of surgery. We try to make accommodations for patient's preferences in scheduling surgery, but sometimes the OR schedule or the surgeon's schedule prevents [place] from making those accommodations.  If you have not heard from our office [phone]) in 5 working days, call the office and ask for your surgeon's nurse.  If you have other questions about your diagnosis, plan, or surgery, call the office and ask for your surgeon's nurse.  The anatomy of the intragluteal cleft was discussed. Pathophysiology of pilonidal disease was discussed. The importance of keeping hairs trimmed to avoid recurrence was discussed. Discussion of options such as curretage, excision with closure vs  leaving open was discussed. Risks of infection with need for incision and drainage & antibiotics were discussed. I noted a good likelihood this will help address the problem.  At this point, I think the patient would best served with considering surgery to excise the diseased tissue. I will make an attempt to close but it may need to be left open to allow it to heal with secondary intention and wound packing. Possible recurrences need reoperation or different techniques were discussed as well. I noted that recurrence is higher with poor compliance on hair removal hygiene & overall health. The patient's questions were answered. The patient agrees to proceed.  Pt Education - Pamphlet Given - Pilonidal Disease: discussed with patient and provided information. Pt Education - [facility] Pilonidal Disease (AT) Pt Education - [facility] Pilonidal Surgery HCI - post op instructions: discussed with patient and provided information.  Kyle SportsmanSteven C. Alonso Gapinski, M.D., F.A.C.S. Gastrointestinal and Minimally Invasive Surgery [facility] [address] [place] [id number] [phone] Main / Paging

## 2015-12-25 ENCOUNTER — Telehealth (INDEPENDENT_AMBULATORY_CARE_PROVIDER_SITE_OTHER): Payer: Self-pay

## 2015-12-25 NOTE — Telephone Encounter (Signed)
90 Day Supply for  Lisinopril/HCTZ TAB 20-12.5 Quantity-90 Take 1 Tablet by mouth daily.

## 2015-12-26 MED ORDER — LISINOPRIL-HYDROCHLOROTHIAZIDE 20-12.5 MG PO TABS: 1.0000 | ORAL_TABLET | Freq: Every day | ORAL | 1 refills | Status: DC

## 2015-12-26 NOTE — Addendum Note (Signed)
Addended by: Gaspar BiddingIGBY, Quay Simkin D on: 12/26/2015 12:18 AM   Modules accepted: Orders

## 2016-11-07 ENCOUNTER — Other Ambulatory Visit (INDEPENDENT_AMBULATORY_CARE_PROVIDER_SITE_OTHER): Payer: Self-pay | Admitting: Sports Medicine

## 2017-02-06 ENCOUNTER — Other Ambulatory Visit (INDEPENDENT_AMBULATORY_CARE_PROVIDER_SITE_OTHER): Payer: Self-pay | Admitting: Sports Medicine

## 2017-03-03 ENCOUNTER — Encounter: Payer: Self-pay | Admitting: Sports Medicine

## 2017-03-03 ENCOUNTER — Ambulatory Visit (INDEPENDENT_AMBULATORY_CARE_PROVIDER_SITE_OTHER): Payer: 59 | Admitting: Sports Medicine

## 2017-03-03 VITALS — BP 118/84 | HR 85 | Ht 75.0 in | Wt 318.8 lb

## 2017-03-03 DIAGNOSIS — Z23 Encounter for immunization: Secondary | ICD-10-CM

## 2017-03-03 DIAGNOSIS — E6609 Other obesity due to excess calories: Secondary | ICD-10-CM | POA: Diagnosis not present

## 2017-03-03 DIAGNOSIS — L0591 Pilonidal cyst without abscess: Secondary | ICD-10-CM | POA: Diagnosis not present

## 2017-03-03 DIAGNOSIS — Z6839 Body mass index (BMI) 39.0-39.9, adult: Secondary | ICD-10-CM

## 2017-03-03 DIAGNOSIS — I10 Essential (primary) hypertension: Secondary | ICD-10-CM | POA: Diagnosis not present

## 2017-03-03 MED ORDER — LISINOPRIL-HYDROCHLOROTHIAZIDE 20-12.5 MG PO TABS: ORAL_TABLET | ORAL | 1 refills | Status: DC

## 2017-03-03 NOTE — Assessment & Plan Note (Signed)
Needs to have this addressed but time away from work is the issue. Cautioned on red flags and recommended f/u with surgery

## 2017-03-03 NOTE — Patient Instructions (Signed)
Get me the lab results when you have them.  Please call our main number [phone]) to schedule appointments.  If there is any issue with getting scheduled you can try: [phone] or send us a message through [place]  Here are some basic exercise recommendations to remember:  Try to be active every day and throughout the day.    We have actually found that being active throughout the day is likely more important than getting to the gym 5 days per week.  Minimizing being in active should be an important health goal we all are working towards.  A basic starting point can be limiting the time that you sit or lay still during the day.  You should sit/lay/lounge for no longer than 20-30 minutes at a time if you are able.  Even interrupting sitting with standing/jumping jacks/dancing/etc for one minute can have significant health benefits.   Try to remember: "Why sit when you can stand, why stand when you can walk, why walk when you can run."  The point he is to look for opportunities during the day where you can increase your heart rate.    Ideally, I recommend you exercise for at least 30 minutes per day, 5 days per week.  This would involve any activity that will elevate your heart rate to the point that you have a hard time carrying on a normal conversation, but not to the point of being completely out of breath.  There are alternative options however this is a generally good goal to strive for.  You can adjust your intensity based on heart rate (HR).  To calculate your target HR take 220 minus your age, then multiply X 0.7 (70%).  Example for 37 year old:  220 - 40 = 180;  180 X 0.7 = 126  Try to keep your HR within 10 beats of this target throughout your exercise   I am always happy to talk more about "Exercise as medicine" if you are interested"

## 2017-03-03 NOTE — Progress Notes (Signed)
Veverly FellsMichael D. Delorise Shinerigby, DO  [facility] [phone]  Kyle Rosndrew Jacome - 37 y.o. male MRN [id number]  Date of birth: 07/19/1980  Visit Date: 03/03/2017  PCP: Andrena Mewsigby, Argyle Gustafson D, DO   Referred by: Andrena Mewsigby, Cyd Hostler D, DO   Scribe for today's visit: Stevenson ClinchBrandy Coleman, CMA     SUBJECTIVE:  Kyle [name] is here for Follow-up (HTN)  Pt does not check BP at home regularly.  Recent BP readings 116/80, 120/82 Medication: Lisinopril-HCTZ 20-12.5mg  once daily Side effects: urinary frequency. He denies HA but has noticed some visual changes (does have astigmatism).   Had wellness labs done last week but hasn't received results yet.   + family hx of DMII (father, paternal grandmother)       HISTORY & PERTINENT PRIOR DATA:  Prior History reviewed and updated per electronic medical record.  Significant history, findings, studies and interim changes include:  reports that  has never smoked. he has never used smokeless tobacco. No results for input(s): HGBA1C, LABURIC, CREATINE in the last 8760 hours. No specialty comments available. Problem  Htn, Goal Below 140/90  Pilonidal Cyst Without Infection    OBJECTIVE:  VS:  HT:6\' 3"  (190.5 cm)   WT:(!) 318 lb 12.8 oz (144.6 kg)  BMI:39.85    BP:118/84  HR:85bpm  TEMP: ( )  RESP:97 %    Wt Readings from Last 3 Encounters:  03/03/17 (!) 318 lb 12.8 oz (144.6 kg)  11/19/15 (!) 320 lb (145.2 kg)  03/08/15 (!) 325 lb 6.4 oz (147.6 kg)     PHYSICAL EXAM: Constitutional: WDWN, Non-toxic appearing. Psychiatric: Alert & appropriately interactive.  Not depressed or anxious appearing. Respiratory: No increased work of breathing.  Trachea Midline Eyes: Pupils are equal.  EOM intact without nystagmus.  No scleral icterus  NEUROVASCULAR exam: No clubbing or cyanosis appreciated No significant venous stasis changes Capillary Refill: normal, less than 2 seconds   HEART:  RRR, S1/S2 heard, no  murmur LUNGS:  CTA B EXTREMITIES: No edema DP and PT pulses 2+/4    ASSESSMENT & PLAN:   1. Need for Tdap vaccination   2. HTN, goal below 140/90   3. Pilonidal cyst without infection    PLAN:   Health Care Maintenance. Tdap today Blood work pending from [employer] Counseling on exercise   HTN, goal below 140/90 Improved and doing well on low dose dual therapy.  NO changes at this time Labs pending from [employer] insurance review.   Pilonidal cyst without infection Needs to have this addressed but time away from work is the issue. Cautioned on red flags and recommended f/u with surgery   ++++++++++++++++++++++++++++++++++++++++++++ Orders & Meds: Orders Placed This Encounter  Procedures  . Tdap vaccine greater than or equal to 7yo IM    Meds ordered this encounter  Medications  . lisinopril-hydrochlorothiazide (PRINZIDE,ZESTORETIC) 20-12.5 MG tablet    Sig: TAKE 1 TABLET BY MOUTH DAILY. Office visit before next refill    Dispense:  90 tablet    Refill:  1    ++++++++++++++++++++++++++++++++++++++++++++ Follow-up: Return in about 1 year (around 03/03/2018).   Pertinent documentation may be included in additional procedure notes, imaging studies, problem based documentation and patient instructions. Please see these sections of the encounter for additional information regarding this visit. CMA/ATC served as Neurosurgeonscribe during this visit. History, Physical, and Plan performed by medical provider. Documentation and orders reviewed and attested to.      Andrena MewsMichael D Vidal Lampkins, DO  [facility] Physician

## 2017-03-03 NOTE — Assessment & Plan Note (Signed)
Improved and doing well on low dose dual therapy.  NO changes at this time Labs pending from [employer] insurance review.

## 2017-03-04 DIAGNOSIS — Z6839 Body mass index (BMI) 39.0-39.9, adult: Secondary | ICD-10-CM

## 2017-03-04 DIAGNOSIS — E6609 Other obesity due to excess calories: Secondary | ICD-10-CM | POA: Insufficient documentation

## 2017-04-24 ENCOUNTER — Ambulatory Visit (HOSPITAL_COMMUNITY)
Admission: EM | Admit: 2017-04-24 | Discharge: 2017-04-24 | Disposition: A | Discharge status: Home / Self Care | Payer: 59 | Attending: Urgent Care | Admitting: Urgent Care

## 2017-04-24 ENCOUNTER — Encounter (HOSPITAL_COMMUNITY): Payer: Self-pay | Admitting: Emergency Medicine

## 2017-04-24 DIAGNOSIS — L739 Follicular disorder, unspecified: Secondary | ICD-10-CM | POA: Diagnosis not present

## 2017-04-24 HISTORY — DX: Essential (primary) hypertension: I10

## 2017-04-24 MED ORDER — CEPHALEXIN 500 MG PO CAPS: 500.0000 mg | ORAL_CAPSULE | Freq: Three times a day (TID) | ORAL | 0 refills | Status: DC

## 2017-04-24 NOTE — ED Provider Notes (Signed)
  MRN: [id number] DOB: 09/17/1980  Subjective:   Kyle [name] is a 37 y.o. male presenting for recurrent chronic skin lesions over his armpits, upper arms, his thighs and groin area.  Lesions become painful, red and come to ahead.  Patient states that he frequently pops them and they drain away on their own.  He is worried about infection given that his wife was recently diagnosed with staph infection is very similar symptoms.  No current facility-administered medications for this encounter.   Current Outpatient Medications:  .  acetaminophen (TYLENOL) 500 MG tablet, Take 500 mg by mouth every 6 (six) hours as needed for mild pain. Reported on 03/08/2015, Disp: , Rfl:  .  lisinopril-hydrochlorothiazide (PRINZIDE,ZESTORETIC) 20-12.5 MG tablet, TAKE 1 TABLET BY MOUTH DAILY. Office visit before next refill, Disp: 90 tablet, Rfl: 1   No Known Allergies  Past Medical History:  Diagnosis Date  . Hypertension      History reviewed. No pertinent surgical history.  Objective:   Vitals: BP (!) 150/82   Pulse 88   Temp 98.8 F (37.1 C)   Resp 16   SpO2 97%   Physical Exam  Constitutional: He is oriented to person, place, and time. He appears well-developed and well-nourished.  Cardiovascular: Normal rate.  Pulmonary/Chest: Effort normal.  Neurological: He is alert and oriented to person, place, and time.  Skin:  Multiple solitary erythematous nodules associated with hair follicle over axillary area, upper arms, thighs.   Assessment and Plan :   Folliculitis  Physical exam findings consistent with folliculitis.  Patient will try a course of Keflex to address this issue. Counseled patient on potential for adverse effects with medications prescribed today, patient verbalized understanding. Return-to-clinic precautions discussed, patient verbalized understanding.    Kyle [name], Kyle [name], [place]-C 04/24/17 1936

## 2017-04-24 NOTE — ED Triage Notes (Signed)
Pt c/o cysts all over his body, under armpits, thighs etc, states his wife was recently dx with staph infection

## 2017-09-03 ENCOUNTER — Other Ambulatory Visit: Payer: Self-pay | Admitting: Sports Medicine

## 2017-10-06 ENCOUNTER — Ambulatory Visit: Payer: Self-pay | Admitting: Surgery

## 2017-10-06 DIAGNOSIS — L739 Follicular disorder, unspecified: Secondary | ICD-10-CM | POA: Diagnosis not present

## 2017-10-06 DIAGNOSIS — L0591 Pilonidal cyst without abscess: Secondary | ICD-10-CM | POA: Diagnosis not present

## 2017-10-06 MED FILL — LISINOPRIL-HCTZ 20-12.5 MG: 20-12.5 | 90 days supply | Qty: 90 | Fill #0

## 2017-10-06 MED FILL — DOXYCYCLINE HYCLATE 100 MG: 100 | 25 days supply | Qty: 50 | Fill #0

## 2017-10-06 NOTE — H&P (Signed)
Kyle [name] Documented: 10/06/2017 12:23 PM Location: [facility] Patient #: [id number] DOB: 1980/06/02 Married / Language: [place] / Race: White Male  [name] All Reviewed History of Present Illness Kyle Sportsman MD; 10/06/2017 1:07 PM) The patient is a 37 year old male who presents with a pilonidal cyst. Note for "Pilonidal cyst": ` ` Patient sent for surgical consultation at the request of Kyle [name] with [facility].   Chief Complaint: Persistent pilonidal disease ` ` Pleasant big active male. 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 He has had infections on his tailbone for over 15 years. He is often had to have incision and drainage done. Sometimes even packed. Never completely resolved. He had another episode 2016 that required incision and drainage in the emergency room. Often needs oral antibiotics with it. He is now noticed a lump back there. Mentioned to his primary care physician. Surgical consultation recommended. I saw 2017. Diagnosis of pilonidal disease. Recommended surgical treatment. He held off to do work issues.  He returns 2 years later with persistent bowel disease. He also mentions he gets some rashes and acne in his inner thighs and sometimes his chest wall and armpits. Nothing too severe but can be persistent and annoying.  Patient does smoke a cigar month maybe. Has not done cigarettes in over a decade. No problems with hemorrhoids or other issues. He may have had a slightly elevated glucose but no true diagnosis of diabetes. He can walk several miles without difficulty. Usually moves his bowels once or twice a day. No personal nor family history of GI/colon cancer, inflammatory bowel disease, irritable bowel syndrome, allergy such as Celiac Sprue, dietary/dairy problems, colitis, ulcers nor gastritis. No recent sick contacts/gastroenteritis. No travel outside the country. No changes in diet. No dysphagia to solids or  liquids. No significant heartburn or reflux. No hematochezia, hematemesis, coffee ground emesis. No evidence of prior gastric/peptic ulceration.  (Review of systems as stated in this history (HPI) or in the review of systems. Otherwise all other 12 point [name] are negative) ` ` `   Problem List/Past Medical Kyle Sportsman, MD; 10/06/2017 12:53 PM) PILONIDAL CYST WITHOUT INFECTION (L05.91)  Past Surgical History Kyle Sportsman, MD; 10/06/2017 12:53 PM) Oral Surgery  Allergies Kyle [name]; 10/06/2017 12:24 PM) No Known Drug Allergies [12/11/2015]: Allergies Reconciled  Medication History Kyle [name]; 10/06/2017 12:24 PM) Lisinopril-Hydrochlorothiazide (20-12.5MG  Tablet, Oral) Active. Acetaminophen (500MG  Tablet, Oral) Active. Medications Reconciled  Social History Kyle Sportsman, MD; 10/06/2017 12:53 PM) Alcohol use Occasional alcohol use. Caffeine use Coffee, Tea. No drug use Tobacco use Former smoker.  Family History Kyle Sportsman, MD; 10/06/2017 12:53 PM) Diabetes Mellitus Father. Hypertension Father. Melanoma Father.  Other Problems Kyle Sportsman, MD; 10/06/2017 12:53 PM) High blood pressure     Review of Systems Kyle Sportsman MD; 10/06/2017 12:53 PM) Kyle [name] as Reviewed General Not Present- Appetite Loss, Chills, Fatigue, Fever, Night Sweats, Weight Gain and Weight Loss. Skin Present- New Lesions. Not Present- Change in Wart/Mole, Dryness, Hives, Jaundice, Non-Healing Wounds, Rash and Ulcer. HEENT Not Present- Earache, Hearing Loss, Hoarseness, Nose Bleed, Oral Ulcers, Ringing in the Ears, Seasonal Allergies, Sinus Pain, Sore Throat, Visual Disturbances, Wears glasses/contact lenses and Yellow Eyes. Respiratory Not Present- Bloody sputum, Chronic Cough, Difficulty Breathing, Snoring and Wheezing. Breast Not Present- Breast Mass, Breast Pain, Nipple Discharge and Skin Changes. Cardiovascular Not Present- Chest Pain, Difficulty  Breathing Lying Down, Leg Cramps, Palpitations, Rapid Heart Rate, Shortness of Breath and Swelling of Extremities. Gastrointestinal  Not Present- Abdominal Pain, Bloating, Bloody Stool, Change in Bowel Habits, Chronic diarrhea, Constipation, Difficulty Swallowing, Excessive gas, Gets full quickly at meals, Hemorrhoids, Indigestion, Nausea, Rectal Pain and Vomiting. Male Genitourinary Not Present- Blood in Urine, Change in Urinary Stream, Frequency, Impotence, Nocturia, Painful Urination, Urgency and Urine Leakage. Musculoskeletal Not Present- Back Pain, Joint Pain, Joint Stiffness, Muscle Pain, Muscle Weakness and Swelling of Extremities. Neurological Not Present- Decreased Memory, Fainting, Headaches, Numbness, Seizures, Tingling, Tremor, Trouble walking and Weakness. Psychiatric Not Present- Anxiety, Bipolar, Change in Sleep Pattern, Depression, Fearful and Frequent crying. Endocrine Not Present- Cold Intolerance, Excessive Hunger, Hair Changes, Heat Intolerance and New Diabetes. Hematology Not Present- Blood Thinners, Easy Bruising, Excessive bleeding, Gland problems, HIV and Persistent Infections.  Vitals Kyle [name]; 10/06/2017 12:25 PM) 10/06/2017 12:24 PM Weight: 315.4 lb Height: 75in Body Surface Area: 2.66 m Body Mass Index: 39.42 kg/m  Pain Level: 5/10 Temp.: 71F(Temporal)  Pulse: 104 (Regular)  P.OX: 97% (Room air) BP: 130/88 (Sitting, Left Arm, Standard) Left Heel Pain     Physical Exam Kyle Sportsman MD; 10/06/2017 1:03 PM)  General Mental Status-Alert. General Appearance-Not in acute distress, Not Sickly. Orientation-Oriented X3. Hydration-Well hydrated. Voice-Normal.  Integumentary Global Assessment Normal Exam - Axillae: non-tender, no inflammation or ulceration, no drainage. and Distribution of scalp and body hair is normal. General Characteristics Temperature - normal warmth is noted. Note: Mild folliculitis along the left  lateral chest wall. Axillae rather spared. Inguinal regions rather spared. Some evidence of old folliculitis and inner left thigh but no active folliculitis right now.  Head and Neck Head-normocephalic, atraumatic with no lesions or palpable masses. Face Global Assessment - atraumatic, no absence of expression. Neck Global Assessment - no abnormal movements, no bruit auscultated on the right, no bruit auscultated on the left, no decreased range of motion, non-tender. Trachea-midline. Thyroid Gland Characteristics - non-tender.  Eye Eyeball - Left-Extraocular movements intact, No Nystagmus. Eyeball - Right-Extraocular movements intact, No Nystagmus. Cornea - Left-No Hazy. Cornea - Right-No Hazy. Sclera/Conjunctiva - Left-No scleral icterus, No Discharge. Sclera/Conjunctiva - Right-No scleral icterus, No Discharge. Pupil - Left-Direct reaction to light normal. Pupil - Right-Direct reaction to light normal.  ENMT Ears Pinna - Left - no drainage observed, no generalized tenderness observed. Right - no drainage observed, no generalized tenderness observed. Nose and Sinuses Nose - no destructive lesion observed. Nares - Left - quiet respiration. Right - quiet respiration. Mouth and Throat Lips - Upper Lip - no fissures observed, no pallor noted. Lower Lip - no fissures observed, no pallor noted. Nasopharynx - no discharge present. Oral Cavity/Oropharynx - Tongue - no dryness observed. Oral Mucosa - no cyanosis observed. Hypopharynx - no evidence of airway distress observed.  Chest and Lung Exam Inspection Movements - Normal and Symmetrical. Accessory muscles - No use of accessory muscles in breathing. Palpation Normal exam - Non-tender. Auscultation Breath sounds - Normal and Clear.  Cardiovascular Auscultation Rhythm - Regular. Murmurs & Other Heart Sounds - Normal exam - No Murmurs and No Systolic Clicks.  Abdomen Inspection Normal Exam - No Visible  peristalsis and No Abnormal pulsations. Umbilicus - No Bleeding, No Urine drainage. Palpation/Percussion Normal exam - Soft, Non Tender, No Rebound tenderness, No Rigidity (guarding) and No Cutaneous hyperesthesia. Note: Abdomen obese but soft. No diastases recti. Nontender, nondistended. No guarding. No umbilical no other hernias  Male Genitourinary Sexual Maturity Tanner 5 - Adult hair pattern and Adult penile size and shape. Note: No inguinal hernias. Normal external genitalia. Epididymi, testes, and spermatic  cords normal without any masses.  Rectal Note: 2 x 1 cm purple hypertrophic nodule at right apex of intergluteal cleft consistent with probable chronic cystic granulation tissue. A few pits in the midline. Consistent with chronic pilonidal disease. No active cellulitis or abscess. Similar to 2017  Perianal skin clean with good hygiene. No pruritis ani. No fissure. No abscess/fistula. Normal sphincter tone. No external hemorrhoids.  Peripheral Vascular Upper Extremity Inspection - Left - No Cyanotic nailbeds, Not Ischemic. Right - No Cyanotic nailbeds, Not Ischemic.  Neurologic Neurologic evaluation reveals -normal attention span and ability to concentrate, able to name objects and repeat phrases. Appropriate fund of knowledge , normal sensation and normal coordination. Mental Status Affect - not angry, not paranoid. Cranial Nerves-Normal Bilaterally. Gait-Normal.  Neuropsychiatric Mental status exam performed with findings of-able to articulate well with normal speech/language, rate, volume and coherence, thought content normal with ability to perform basic computations and apply abstract reasoning and no evidence of hallucinations, delusions, obsessions or homicidal/suicidal ideation.  Musculoskeletal Global Assessment Spine, Ribs and Pelvis - no instability, subluxation or laxity. Right Upper Extremity - no instability, subluxation or  laxity.  Lymphatic Head & Neck General Head & Neck Lymphatics: Bilateral - Description - No Localized lymphadenopathy. Axillary General Axillary Region: Bilateral - Description - No Localized lymphadenopathy. Femoral & Inguinal Generalized Femoral & Inguinal Lymphatics: Left: Right - Description - No Localized lymphadenopathy. Description - No Localized lymphadenopathy.    Assessment & Plan Kyle Sportsman MD; 10/06/2017 1:04 PM)  PILONIDAL CYST WITHOUT INFECTION (L05.91) Impression: Very classic history and physical for pilonidal disease. He does have a hypertrophic nodule. It doesn't look like a skin cancer but nonetheless I would definitely get rid of that region.  He's interested in proceeding. We'll work to coordinate a convenient time   The anatomy of the gluteal and sacral region was discussed. Pathophysiology of pilonidal disease due to ingrown hairs was discussed. Importance of good hygiene discussed. Importance of keeping hairs trimmed in the intergluteal crease from anus to top of sacrum/tailbone spine noted. Need for good hygiene stressed. Use of electric razor or nose hair trimmers to keep the hairs trimmed discussed.  Risk of worsening progression needing surgical drainage of abscess or perhaps excision of chronic pilonidal disease with skin flap closure over drains discussed. Possible need for her to leave it open with packing to allow the wound close over several weeks/months discussed. Risks benefits alternatives to surgery discussed as well. Risk of recurrent disease discussed. Patient was expressed understanding. Literature given to patient. We will see if we can control this without an operation first.  Current Plans You are being scheduled for surgery- Our schedulers will call you.  You should hear from our office's scheduling department within 5 working days about the location, date, and time of surgery. We try to make accommodations for patient's preferences in  scheduling surgery, but sometimes the OR schedule or the surgeon's schedule prevents [place] from making those accommodations.  If you have not heard from our office [phone]) in 5 working days, call the office and ask for your surgeon's nurse.  If you have other questions about your diagnosis, plan, or surgery, call the office and ask for your surgeon's nurse.  The anatomy of the intragluteal cleft was discussed. Pathophysiology of pilonidal disease was discussed. The importance of keeping hairs trimmed to avoid recurrence was discussed. Discussion of options such as curretage, excision with closure vs leaving open was discussed. Risks of infection with need for incision and  drainage & antibiotics were discussed. I noted a good likelihood this will help address the problem.  At this point, I think the patient would best served with considering surgery to excise the diseased tissue. I will make an attempt to close but it may need to be left open to allow it to heal with secondary intention and wound packing. Possible recurrences need reoperation or different techniques were discussed as well. I noted that recurrence is higher with poor compliance on hair removal hygiene & overall health. The patient's questions were answered. The patient agrees to proceed.  Pt Education - Pamphlet Given - Pilonidal Disease: discussed with patient and provided information. Pt Education - [facility] Pilonidal Disease (AT) Pt Education - [facility] Pilonidal Surgery HCI - post op instructions: discussed with patient and provided information.  FOLLICULITIS OF PERINEUM (L73.9) Impression: Intermittent flares of folliculitis in inner thigh/perineum and chest wall. Not to the point of progressing to hydradenitis.  I think he has a good chance of not need anything more than continued good hygiene and occasional doxycycline. We'll provide a prescription with probable refills  Current Plans Started Doxycycline Hyclate 100 MG Oral  Capsule, 1 (one) Capsule two times daily, #50, 25 days starting 10/06/2017, Ref. x2.  FOLLICULITIS OF AXILLA (L73.9)  Kyle SportsmanSteven C. Mirissa Lopresti, MD, FACS, MASCRS [facility]    [address] [place] [id number] [phone] Main / Paging [phone] Fax

## 2017-10-12 ENCOUNTER — Ambulatory Visit: Payer: Self-pay | Admitting: Surgery

## 2017-10-12 NOTE — H&P (Signed)
Steward Ros Documented: 10/06/2017 12:23 PM Location: [facility] Patient #: [id number] DOB: [birth year] Married / Language: [place] / Race: White Male   History of Present Illness [name] MD; 10/06/2017 1:07 PM) The patient is a 37 year old male who presents with a pilonidal cyst. Note for "Pilonidal cyst": ` ` Patient sent for surgical consultation at the request of [name] with [facility] orthopedics.   Chief Complaint: Persistent pilonidal disease ` ` Pleasant big active male. 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 He has had infections on his tailbone for over 15 years. He is often had to have incision and drainage done. Sometimes even packed. Never completely resolved. He had another episode 2016 that required incision and drainage in the emergency room. Often needs oral antibiotics with it. He is now noticed a lump back there. Mentioned to his primary care physician. Surgical consultation recommended. I saw 2017. Diagnosis of pilonidal disease. Recommended surgical treatment. He held off to do work issues.  He returns 2 years later with persistent bowel disease. He also mentions he gets some rashes and acne in his inner thighs and sometimes his chest wall and armpits. Nothing too severe but can be persistent and annoying.  Patient does smoke a cigar month maybe. Has not done cigarettes in over a decade. No problems with hemorrhoids or other issues. He may have had a slightly elevated glucose but no true diagnosis of diabetes. He can walk several miles without difficulty. Usually moves his bowels once or twice a day. No personal nor family history of GI/colon cancer, inflammatory bowel disease, irritable bowel syndrome, allergy such as Celiac Sprue, dietary/dairy problems, colitis, ulcers nor gastritis. No recent sick contacts/gastroenteritis. No travel outside the country. No changes in diet. No dysphagia to solids or liquids. No  significant heartburn or reflux. No hematochezia, hematemesis, coffee ground emesis. No evidence of prior gastric/peptic ulceration.  (Review of systems as stated in this history (HPI) or in the review of systems. Otherwise all other 12 point ROS are negative) ` ` `   Problem List/Past Medical [name], MD; 10/06/2017 12:53 PM) PILONIDAL CYST WITHOUT INFECTION (L05.91)   Past Surgical History [name], MD; 10/06/2017 12:53 PM) Oral Surgery   Allergies [name]; 10/06/2017 12:24 PM) No Known Drug Allergies [12/11/2015]: Allergies Reconciled   Medication History [name]; 10/06/2017 12:24 PM) Lisinopril-Hydrochlorothiazide (20-12.5MG  Tablet, Oral) Active. Acetaminophen (500MG  Tablet, Oral) Active. Medications Reconciled  Social History [name], MD; 10/06/2017 12:53 PM) Alcohol use  Occasional alcohol use. Caffeine use  Coffee, Tea. No drug use  Tobacco use  Former smoker.  Family History [name], MD; 10/06/2017 12:53 PM) Diabetes Mellitus  Father. Hypertension  Father. Melanoma  Father.  Other Problems [name], MD; 10/06/2017 12:53 PM) High blood pressure     Review of Systems [name], MD; 10/06/2017 1:10 PM) General Not Present- Appetite Loss, Chills, Fatigue, Fever, Night Sweats, Weight Gain and Weight Loss. Skin Present- New Lesions. Not Present- Change in Wart/Mole, Dryness, Hives, Jaundice, Non-Healing Wounds, Rash and Ulcer. HEENT Not Present- Earache, Hearing Loss, Hoarseness, Nose Bleed, Oral Ulcers, Ringing in the Ears, Seasonal Allergies, Sinus Pain, Sore Throat, Visual Disturbances, Wears glasses/contact lenses and Yellow Eyes. Respiratory Not Present- Bloody sputum, Chronic Cough, Difficulty Breathing, Snoring and Wheezing. Breast Not Present- Breast Mass, Breast Pain, Nipple Discharge and Skin Changes. Cardiovascular Not Present- Chest Pain, Difficulty Breathing Lying Down, Leg  Cramps, Palpitations, Rapid Heart Rate, Shortness of Breath  and Swelling of Extremities. Gastrointestinal Not Present- Abdominal Pain, Bloating, Bloody Stool, Change in Bowel Habits, Chronic diarrhea, Constipation, Difficulty Swallowing, Excessive gas, Gets full quickly at meals, Hemorrhoids, Indigestion, Nausea, Rectal Pain and Vomiting. Male Genitourinary Not Present- Blood in Urine, Change in Urinary Stream, Frequency, Impotence, Nocturia, Painful Urination, Urgency and Urine Leakage. Musculoskeletal Not Present- Back Pain, Joint Pain, Joint Stiffness, Muscle Pain, Muscle Weakness and Swelling of Extremities. Neurological Not Present- Decreased Memory, Fainting, Headaches, Numbness, Seizures, Tingling, Tremor, Trouble walking and Weakness. Psychiatric Not Present- Anxiety, Bipolar, Change in Sleep Pattern, Depression, Fearful and Frequent crying. Endocrine Not Present- Cold Intolerance, Excessive Hunger, Hair Changes, Heat Intolerance and New Diabetes. Hematology Not Present- Blood Thinners, Easy Bruising, Excessive bleeding, Gland problems, HIV and Persistent Infections.  Vitals [name]; 10/06/2017 12:25 PM) 10/06/2017 12:24 PM Weight: 315.4 lb Height: 75in Body Surface Area: 2.66 m Body Mass Index: 39.42 kg/m  Pain Level: 5/10 Temp.: 38F(Temporal)  Pulse: 104 (Regular)  P.OX: 97% (Room air) BP: 130/88 (Sitting, Left Arm, Standard) Left Heel Pain      Physical Exam [name] MD; 10/06/2017 1:03 PM) General Mental Status-Alert. General Appearance-Not in acute distress, Not Sickly. Orientation-Oriented X3. Hydration-Well hydrated. Voice-Normal.  Integumentary Global Assessment Normal Exam - Axillae: non-tender, no inflammation or ulceration, no drainage. and Distribution of scalp and body hair is normal. General Characteristics Temperature - normal warmth is noted. Note: Mild folliculitis along the left lateral chest wall. Axillae  rather spared. Inguinal regions rather spared. Some evidence of old folliculitis and inner left thigh but no active folliculitis right now.   Head and Neck Head-normocephalic, atraumatic with no lesions or palpable masses. Face Global Assessment - atraumatic, no absence of expression. Neck Global Assessment - no abnormal movements, no bruit auscultated on the right, no bruit auscultated on the left, no decreased range of motion, non-tender. Trachea-midline. Thyroid Gland Characteristics - non-tender.  Eye Eyeball - Left-Extraocular movements intact, No Nystagmus. Eyeball - Right-Extraocular movements intact, No Nystagmus. Cornea - Left-No Hazy. Cornea - Right-No Hazy. Sclera/Conjunctiva - Left-No scleral icterus, No Discharge. Sclera/Conjunctiva - Right-No scleral icterus, No Discharge. Pupil - Left-Direct reaction to light normal. Pupil - Right-Direct reaction to light normal.  ENMT Ears Pinna - Left - no drainage observed, no generalized tenderness observed. Right - no drainage observed, no generalized tenderness observed. Nose and Sinuses Nose - no destructive lesion observed. Nares - Left - quiet respiration. Right - quiet respiration. Mouth and Throat Lips - Upper Lip - no fissures observed, no pallor noted. Lower Lip - no fissures observed, no pallor noted. Nasopharynx - no discharge present. Oral Cavity/Oropharynx - Tongue - no dryness observed. Oral Mucosa - no cyanosis observed. Hypopharynx - no evidence of airway distress observed.  Chest and Lung Exam Inspection Movements - Normal and Symmetrical. Accessory muscles - No use of accessory muscles in breathing. Palpation Normal exam - Non-tender. Auscultation Breath sounds - Normal and Clear.  Cardiovascular Auscultation Rhythm - Regular. Murmurs & Other Heart Sounds - Normal exam - No Murmurs and No Systolic Clicks.  Abdomen Inspection Normal Exam - No Visible peristalsis and No Abnormal  pulsations. Umbilicus - No Bleeding, No Urine drainage. Palpation/Percussion Normal exam - Soft, Non Tender, No Rebound tenderness, No Rigidity (guarding) and No Cutaneous hyperesthesia. Note: Abdomen obese but soft. No diastases recti. Nontender, nondistended. No guarding. No umbilical no other hernias   Male Genitourinary Sexual Maturity Tanner 5 - Adult hair pattern and Adult penile size and shape. Note: No inguinal hernias.  Normal external genitalia. Epididymi, testes, and spermatic cords normal without any masses.   Rectal Note: 2 x 1 cm purple hypertrophic nodule at right apex of intergluteal cleft consistent with probable chronic cystic granulation tissue. A few pits in the midline. Consistent with chronic pilonidal disease. No active cellulitis or abscess. Similar to 2017  Perianal skin clean with good hygiene. No pruritis ani. No fissure. No abscess/fistula. Normal sphincter tone. No external hemorrhoids.   Peripheral Vascular Upper Extremity Inspection - Left - No Cyanotic nailbeds, Not Ischemic. Right - No Cyanotic nailbeds, Not Ischemic.  Neurologic Neurologic evaluation reveals -normal attention span and ability to concentrate, able to name objects and repeat phrases. Appropriate fund of knowledge , normal sensation and normal coordination. Mental Status Affect - not angry, not paranoid. Cranial Nerves-Normal Bilaterally. Gait-Normal.  Neuropsychiatric Mental status exam performed with findings of-able to articulate well with normal speech/language, rate, volume and coherence, thought content normal with ability to perform basic computations and apply abstract reasoning and no evidence of hallucinations, delusions, obsessions or homicidal/suicidal ideation.  Musculoskeletal Global Assessment Spine, Ribs and Pelvis - no instability, subluxation or laxity. Right Upper Extremity - no instability, subluxation or laxity.  Lymphatic Head &  Neck General Head & Neck Lymphatics: Bilateral - Description - No Localized lymphadenopathy. Axillary General Axillary Region: Bilateral - Description - No Localized lymphadenopathy. Femoral & Inguinal Generalized Femoral & Inguinal Lymphatics: Left: Right - Description - No Localized lymphadenopathy. Description - No Localized lymphadenopathy.    Assessment & Plan [name] MD; 10/06/2017 1:04 PM) PILONIDAL CYST WITHOUT INFECTION (L05.91) Impression: Very classic history and physical for pilonidal disease. He does have a hypertrophic nodule. It doesn't look like a skin cancer but nonetheless I would definitely get rid of that region.  He's interested in proceeding. We'll work to coordinate a convenient time   The anatomy of the gluteal and sacral region was discussed. Pathophysiology of pilonidal disease due to ingrown hairs was discussed. Importance of good hygiene discussed. Importance of keeping hairs trimmed in the intergluteal crease from anus to top of sacrum/tailbone spine noted. Need for good hygiene stressed. Use of electric razor or nose hair trimmers to keep the hairs trimmed discussed.  Risk of worsening progression needing surgical drainage of abscess or perhaps excision of chronic pilonidal disease with skin flap closure over drains discussed. Possible need for her to leave it open with packing to allow the wound close over several weeks/months discussed. Risks benefits alternatives to surgery discussed as well. Risk of recurrent disease discussed. Patient was expressed understanding. Literature given to patient. We will see if we can control this without an operation first. Current Plans You are being scheduled for surgery- Our schedulers will call you.  You should hear from our office's scheduling department within 5 working days about the location, date, and time of surgery. We try to make accommodations for patient's preferences in scheduling surgery, but sometimes  the OR schedule or the surgeon's schedule prevents [place] from making those accommodations.  If you have not heard from our office [phone]) in 5 working days, call the office and ask for your surgeon's nurse.  If you have other questions about your diagnosis, plan, or surgery, call the office and ask for your surgeon's nurse.  The anatomy of the intragluteal cleft was discussed. Pathophysiology of pilonidal disease was discussed. The importance of keeping hairs trimmed to avoid recurrence was discussed. Discussion of options such as curretage, excision with closure vs leaving open was discussed. Risks  of infection with need for incision and drainage & antibiotics were discussed. I noted a good likelihood this will help address the problem.  At this point, I think the patient would best served with considering surgery to excise the diseased tissue. I will make an attempt to close but it may need to be left open to allow it to heal with secondary intention and wound packing. Possible recurrences need reoperation or different techniques were discussed as well. I noted that recurrence is higher with poor compliance on hair removal hygiene & overall health. The patient's questions were answered. The patient agrees to proceed.  Pt Education - Pamphlet Given - Pilonidal Disease: discussed with patient and provided information. Pt Education - [facility] Pilonidal Disease (AT) Pt Education - [facility] Pilonidal Surgery HCI - post op instructions: discussed with patient and provided information. FOLLICULITIS OF PERINEUM (L73.9) Impression: Intermittent flares of folliculitis in inner thigh/perineum and chest wall. Not to the point of progressing to hydradenitis.  I think he has a good chance of not need anything more than continued good hygiene and occasional doxycycline. We'll provide a prescription with probable refills Current Plans Started Doxycycline Hyclate 100 MG Oral Capsule, 1 (one) Capsule two times daily,  #50, 25 days starting 10/06/2017, Ref. x2. FOLLICULITIS OF AXILLA (L73.9)     [name], MD, FACS, MASCRS Gastrointestinal and Minimally Invasive Surgery    [address], [address] [place], [place] [address] [phone] Main / Paging [phone] Fax

## 2017-11-27 ENCOUNTER — Encounter (HOSPITAL_BASED_OUTPATIENT_CLINIC_OR_DEPARTMENT_OTHER): Payer: Self-pay

## 2017-12-01 ENCOUNTER — Other Ambulatory Visit: Payer: Self-pay

## 2017-12-01 ENCOUNTER — Encounter (HOSPITAL_BASED_OUTPATIENT_CLINIC_OR_DEPARTMENT_OTHER): Payer: Self-pay

## 2017-12-01 NOTE — Progress Notes (Signed)
Stop Bang Score 5, no sleep study performed Spoke with: Greig CastillaAndrew NPO:  No food after midnight/Clear liquids until 7AM DOS Arrival time: 1100AM Labs: Istat 8, EKG AM medications: None Pre op orders: Yes Ride home:  Tresa EndoKelly (wife) [phone] Instructed about Hibiclens shower

## 2017-12-01 NOTE — Pre-Procedure Instructions (Signed)
Stop bang score 5, encouraged Mr. Kyle [name] to ask PCP about having a sleep study performed, he verbalized understanding.

## 2017-12-02 NOTE — Progress Notes (Signed)
SPOKE WITH Kyle [name] BY PHONE AND UPDATED MEDICATIONS. PATIENT INSTRUCTED MAY TAKE DOXYCYCLINE PRN DAY OF SURGERY 12-04-17

## 2017-12-04 ENCOUNTER — Ambulatory Visit (HOSPITAL_BASED_OUTPATIENT_CLINIC_OR_DEPARTMENT_OTHER): Payer: 59 | Admitting: Certified Registered"

## 2017-12-04 ENCOUNTER — Encounter (HOSPITAL_BASED_OUTPATIENT_CLINIC_OR_DEPARTMENT_OTHER): Payer: Self-pay

## 2017-12-04 ENCOUNTER — Encounter (HOSPITAL_BASED_OUTPATIENT_CLINIC_OR_DEPARTMENT_OTHER)
Admission: RE | Disposition: A | Discharge status: Home / Self Care | Payer: Self-pay | Source: Ambulatory Visit | Attending: Surgery

## 2017-12-04 ENCOUNTER — Ambulatory Visit (HOSPITAL_BASED_OUTPATIENT_CLINIC_OR_DEPARTMENT_OTHER)
Admission: RE | Admit: 2017-12-04 | Discharge: 2017-12-04 | Disposition: A | Discharge status: Home / Self Care | Payer: 59 | Source: Ambulatory Visit | Attending: Surgery | Admitting: Surgery

## 2017-12-04 DIAGNOSIS — E1159 Type 2 diabetes mellitus with other circulatory complications: Secondary | ICD-10-CM | POA: Diagnosis present

## 2017-12-04 DIAGNOSIS — I152 Hypertension secondary to endocrine disorders: Secondary | ICD-10-CM | POA: Diagnosis present

## 2017-12-04 DIAGNOSIS — I10 Essential (primary) hypertension: Secondary | ICD-10-CM | POA: Insufficient documentation

## 2017-12-04 DIAGNOSIS — F17290 Nicotine dependence, other tobacco product, uncomplicated: Secondary | ICD-10-CM | POA: Diagnosis not present

## 2017-12-04 DIAGNOSIS — R21 Rash and other nonspecific skin eruption: Secondary | ICD-10-CM | POA: Insufficient documentation

## 2017-12-04 DIAGNOSIS — L0591 Pilonidal cyst without abscess: Secondary | ICD-10-CM | POA: Insufficient documentation

## 2017-12-04 DIAGNOSIS — L738 Other specified follicular disorders: Secondary | ICD-10-CM | POA: Insufficient documentation

## 2017-12-04 DIAGNOSIS — Z808 Family history of malignant neoplasm of other organs or systems: Secondary | ICD-10-CM | POA: Diagnosis not present

## 2017-12-04 DIAGNOSIS — L988 Other specified disorders of the skin and subcutaneous tissue: Secondary | ICD-10-CM | POA: Diagnosis present

## 2017-12-04 HISTORY — DX: Other intervertebral disc displacement, lumbar region: M51.26

## 2017-12-04 HISTORY — PX: PILONIDAL CYST EXCISION: SHX744

## 2017-12-04 HISTORY — DX: Sciatica, unspecified side: M54.30

## 2017-12-04 HISTORY — DX: Presence of spectacles and contact lenses: Z97.3

## 2017-12-04 HISTORY — DX: Other specified disorders of the skin and subcutaneous tissue: L98.8

## 2017-12-04 LAB — POCT I-STAT, CHEM 8
BUN: 15 mg/dL (ref 6–20)
CALCIUM ION: 1.25 mmol/L (ref 1.15–1.40)
CREATININE: 1.2 mg/dL (ref 0.61–1.24)
Chloride: 100 mmol/L (ref 98–111)
Glucose, Bld: 135 mg/dL — ABNORMAL HIGH (ref 70–99)
HCT: 46 % (ref 39.0–52.0)
Hemoglobin: 15.6 g/dL (ref 13.0–17.0)
Potassium: 3.9 mmol/L (ref 3.5–5.1)
SODIUM: 140 mmol/L (ref 135–145)
TCO2: 27 mmol/L (ref 22–32)

## 2017-12-04 SURGERY — EXCISION, PILONIDAL CYST, EXTENSIVE
Anesthesia: General | Site: Coccyx

## 2017-12-04 MED ORDER — METRONIDAZOLE IN NACL 5-0.79 MG/ML-% IV SOLN
INTRAVENOUS | Status: AC
  Filled 2017-12-04: qty 100

## 2017-12-04 MED ORDER — CHLORHEXIDINE GLUCONATE CLOTH 2 % EX PADS
6.0000 | MEDICATED_PAD | Freq: Once | CUTANEOUS | Status: DC
  Filled 2017-12-04: qty 6

## 2017-12-04 MED ORDER — LIDOCAINE 2% (20 MG/ML) 5 ML SYRINGE
INTRAMUSCULAR | Status: AC
  Filled 2017-12-04: qty 5

## 2017-12-04 MED ORDER — CEFAZOLIN SODIUM-DEXTROSE 2-4 GM/100ML-% IV SOLN
INTRAVENOUS | Status: AC
  Filled 2017-12-04: qty 100

## 2017-12-04 MED ORDER — DEXAMETHASONE SODIUM PHOSPHATE 10 MG/ML IJ SOLN
INTRAMUSCULAR | Status: DC | PRN
  Administered 2017-12-04: 10 mg via INTRAVENOUS

## 2017-12-04 MED ORDER — SUGAMMADEX SODIUM 200 MG/2ML IV SOLN
INTRAVENOUS | Status: DC | PRN
  Administered 2017-12-04: 300 mg via INTRAVENOUS

## 2017-12-04 MED ORDER — OXYCODONE HCL 5 MG/5ML PO SOLN
5.0000 mg | Freq: Once | ORAL | Status: DC | PRN
  Filled 2017-12-04: qty 5

## 2017-12-04 MED ORDER — ACETAMINOPHEN 500 MG PO TABS
1000.0000 mg | ORAL_TABLET | ORAL | Status: AC
  Administered 2017-12-04: 1000 mg via ORAL
  Filled 2017-12-04: qty 2

## 2017-12-04 MED ORDER — SUCCINYLCHOLINE CHLORIDE 200 MG/10ML IV SOSY
PREFILLED_SYRINGE | INTRAVENOUS | Status: AC
  Filled 2017-12-04: qty 10

## 2017-12-04 MED ORDER — FENTANYL CITRATE (PF) 100 MCG/2ML IJ SOLN
INTRAMUSCULAR | Status: AC
  Filled 2017-12-04: qty 2

## 2017-12-04 MED ORDER — SUGAMMADEX SODIUM 500 MG/5ML IV SOLN
INTRAVENOUS | Status: AC
  Filled 2017-12-04: qty 5

## 2017-12-04 MED ORDER — MIDAZOLAM HCL 2 MG/2ML IJ SOLN: INTRAMUSCULAR | Status: DC | PRN

## 2017-12-04 MED ORDER — GABAPENTIN 300 MG PO CAPS
ORAL_CAPSULE | ORAL | Status: AC
  Filled 2017-12-04: qty 1

## 2017-12-04 MED ORDER — FENTANYL CITRATE (PF) 100 MCG/2ML IJ SOLN
INTRAMUSCULAR | Status: DC | PRN
  Administered 2017-12-04 (×4): 50 ug via INTRAVENOUS

## 2017-12-04 MED ORDER — GABAPENTIN 300 MG PO CAPS
300.0000 mg | ORAL_CAPSULE | ORAL | Status: AC
  Administered 2017-12-04: 300 mg via ORAL
  Filled 2017-12-04: qty 1

## 2017-12-04 MED ORDER — MIDAZOLAM HCL 2 MG/2ML IJ SOLN
INTRAMUSCULAR | Status: DC | PRN
  Administered 2017-12-04: 2 mg via INTRAVENOUS

## 2017-12-04 MED ORDER — BUPIVACAINE LIPOSOME 1.3 % IJ SUSP
20.0000 mL | Freq: Once | INTRAMUSCULAR | Status: DC
  Filled 2017-12-04: qty 20

## 2017-12-04 MED ORDER — ROCURONIUM BROMIDE 10 MG/ML (PF) SYRINGE
PREFILLED_SYRINGE | INTRAVENOUS | Status: DC | PRN
  Administered 2017-12-04: 20 mg via INTRAVENOUS
  Administered 2017-12-04: 30 mg via INTRAVENOUS

## 2017-12-04 MED ORDER — GABAPENTIN 300 MG PO CAPS: 300.0000 mg | ORAL_CAPSULE | Freq: Two times a day (BID) | ORAL | 1 refills | Status: DC

## 2017-12-04 MED ORDER — PROPOFOL 10 MG/ML IV BOLUS
INTRAVENOUS | Status: AC
  Filled 2017-12-04: qty 20

## 2017-12-04 MED ORDER — TRAMADOL HCL 50 MG PO TABS: 50.0000 mg | ORAL_TABLET | Freq: Four times a day (QID) | ORAL | 0 refills | Status: DC | PRN

## 2017-12-04 MED ORDER — PROMETHAZINE HCL 25 MG/ML IJ SOLN
6.2500 mg | INTRAMUSCULAR | Status: DC | PRN
  Filled 2017-12-04: qty 1

## 2017-12-04 MED ORDER — CEFAZOLIN SODIUM-DEXTROSE 1-4 GM/50ML-% IV SOLN
INTRAVENOUS | Status: AC
  Filled 2017-12-04: qty 50

## 2017-12-04 MED ORDER — CEFAZOLIN SODIUM-DEXTROSE 2-4 GM/100ML-% IV SOLN
2.0000 g | INTRAVENOUS | Status: AC
  Administered 2017-12-04: 2 g via INTRAVENOUS
  Filled 2017-12-04: qty 100

## 2017-12-04 MED ORDER — DEXTROSE 5 % IV SOLN
1000.0000 mg | Freq: Once | INTRAVENOUS | Status: AC
  Administered 2017-12-04: 1000 mg via INTRAVENOUS
  Filled 2017-12-04: qty 10

## 2017-12-04 MED ORDER — ONDANSETRON HCL 4 MG/2ML IJ SOLN
INTRAMUSCULAR | Status: AC
  Filled 2017-12-04: qty 2

## 2017-12-04 MED ORDER — ACETAMINOPHEN 500 MG PO TABS
ORAL_TABLET | ORAL | Status: AC
  Filled 2017-12-04: qty 2

## 2017-12-04 MED ORDER — LIDOCAINE 2% (20 MG/ML) 5 ML SYRINGE
INTRAMUSCULAR | Status: DC | PRN
  Administered 2017-12-04: 1.5 mg/kg/h via INTRAVENOUS

## 2017-12-04 MED ORDER — HYDROMORPHONE HCL 1 MG/ML IJ SOLN
0.2500 mg | INTRAMUSCULAR | Status: DC | PRN
  Filled 2017-12-04: qty 0.5

## 2017-12-04 MED ORDER — BUPIVACAINE-EPINEPHRINE 0.25% -1:200000 IJ SOLN
INTRAMUSCULAR | Status: DC | PRN
  Administered 2017-12-04: 60 mL

## 2017-12-04 MED ORDER — BUPIVACAINE LIPOSOME 1.3 % IJ SUSP
INTRAMUSCULAR | Status: DC | PRN
  Administered 2017-12-04: 20 mL

## 2017-12-04 MED ORDER — LACTATED RINGERS IV SOLN
INTRAVENOUS | Status: DC
  Administered 2017-12-04 (×2): via INTRAVENOUS
  Filled 2017-12-04: qty 1000

## 2017-12-04 MED ORDER — CELECOXIB 200 MG PO CAPS
ORAL_CAPSULE | ORAL | Status: AC
  Filled 2017-12-04: qty 1

## 2017-12-04 MED ORDER — SUCCINYLCHOLINE CHLORIDE 200 MG/10ML IV SOSY
PREFILLED_SYRINGE | INTRAVENOUS | Status: DC | PRN
  Administered 2017-12-04: 120 mg via INTRAVENOUS

## 2017-12-04 MED ORDER — CELECOXIB 200 MG PO CAPS
200.0000 mg | ORAL_CAPSULE | ORAL | Status: AC
  Administered 2017-12-04: 200 mg via ORAL
  Filled 2017-12-04: qty 1

## 2017-12-04 MED ORDER — OXYCODONE HCL 5 MG PO TABS
5.0000 mg | ORAL_TABLET | Freq: Once | ORAL | Status: DC | PRN
  Filled 2017-12-04: qty 1

## 2017-12-04 MED ORDER — MIDAZOLAM HCL 2 MG/2ML IJ SOLN
INTRAMUSCULAR | Status: AC
  Filled 2017-12-04: qty 2

## 2017-12-04 MED ORDER — LIDOCAINE 2% (20 MG/ML) 5 ML SYRINGE
INTRAMUSCULAR | Status: DC | PRN
  Administered 2017-12-04: 30 mg via INTRAVENOUS

## 2017-12-04 MED ORDER — ONDANSETRON HCL 4 MG/2ML IJ SOLN
INTRAMUSCULAR | Status: DC | PRN
  Administered 2017-12-04: 4 mg via INTRAVENOUS

## 2017-12-04 MED ORDER — PROPOFOL 10 MG/ML IV BOLUS
INTRAVENOUS | Status: DC | PRN
  Administered 2017-12-04: 100 mg via INTRAVENOUS
  Administered 2017-12-04: 200 mg via INTRAVENOUS
  Administered 2017-12-04: 100 mg via INTRAVENOUS

## 2017-12-04 MED ORDER — DEXAMETHASONE SODIUM PHOSPHATE 10 MG/ML IJ SOLN
INTRAMUSCULAR | Status: AC
  Filled 2017-12-04: qty 1

## 2017-12-04 MED ORDER — METRONIDAZOLE IN NACL 5-0.79 MG/ML-% IV SOLN
500.0000 mg | INTRAVENOUS | Status: AC
  Administered 2017-12-04: 500 mg via INTRAVENOUS
  Filled 2017-12-04: qty 100

## 2017-12-04 MED FILL — traMADol HCL 50 MG TABS: 50 | 3 days supply | Qty: 20 | Fill #0

## 2017-12-04 MED FILL — GABAPENTIN 300 MG CAPSULE: 300 | 15 days supply | Qty: 60 | Fill #0

## 2017-12-04 SURGICAL SUPPLY — 55 items
BENZOIN TINCTURE PRP APPL 2/3 (GAUZE/BANDAGES/DRESSINGS) IMPLANT
BLADE CLIPPER SURG (BLADE) ×2 IMPLANT
BLADE HEX COATED 2.75 (ELECTRODE) ×2 IMPLANT
BLADE SURG 10 STRL SS (BLADE) ×2 IMPLANT
BLADE SURG 15 STRL LF DISP TIS (BLADE) ×1 IMPLANT
BLADE SURG 15 STRL SS (BLADE) ×1
BRIEF STRETCH FOR OB PAD LRG (UNDERPADS AND DIAPERS) ×2 IMPLANT
CANISTER SUCT 3000ML PPV (MISCELLANEOUS) IMPLANT
CANISTER SUCTION 1200CC (MISCELLANEOUS) IMPLANT
CHLORAPREP W/TINT 26ML (MISCELLANEOUS) IMPLANT
COVER BACK TABLE 60X90IN (DRAPES) ×2 IMPLANT
COVER MAYO STAND STRL (DRAPES) ×2 IMPLANT
COVER WAND RF STERILE (DRAPES) ×2 IMPLANT
DERMABOND ADVANCED (GAUZE/BANDAGES/DRESSINGS) ×1
DERMABOND ADVANCED .7 DNX12 (GAUZE/BANDAGES/DRESSINGS) ×1 IMPLANT
DRAPE LAPAROTOMY 100X72 PEDS (DRAPES) ×2 IMPLANT
DRAPE UTILITY XL STRL (DRAPES) ×2 IMPLANT
DRSG PAD ABDOMINAL 8X10 ST (GAUZE/BANDAGES/DRESSINGS) ×2 IMPLANT
ELECT REM PT RETURN 9FT ADLT (ELECTROSURGICAL) ×2
ELECTRODE REM PT RTRN 9FT ADLT (ELECTROSURGICAL) ×1 IMPLANT
GAUZE IODOFORM PACK 1/2 7832 (GAUZE/BANDAGES/DRESSINGS) ×2 IMPLANT
GAUZE PACKING 1/4 X5 YD (GAUZE/BANDAGES/DRESSINGS) IMPLANT
GAUZE PACKING IODOFORM 1/2 (PACKING) ×2 IMPLANT
GAUZE PACKING IODOFORM 1/4X15 (GAUZE/BANDAGES/DRESSINGS) IMPLANT
GAUZE SPONGE 4X4 12PLY STRL (GAUZE/BANDAGES/DRESSINGS) ×2 IMPLANT
GLOVE ECLIPSE 8.0 STRL XLNG CF (GLOVE) ×2 IMPLANT
GLOVE INDICATOR 8.0 STRL GRN (GLOVE) ×2 IMPLANT
GOWN STRL REUS W/ TWL XL LVL3 (GOWN DISPOSABLE) ×1 IMPLANT
GOWN STRL REUS W/TWL XL LVL3 (GOWN DISPOSABLE) ×1
KIT TURNOVER CYSTO (KITS) ×2 IMPLANT
NEEDLE HYPO 22GX1.5 SAFETY (NEEDLE) ×2 IMPLANT
NS IRRIG 500ML POUR BTL (IV SOLUTION) ×2 IMPLANT
PACK BASIN DAY SURGERY FS (CUSTOM PROCEDURE TRAY) ×2 IMPLANT
PAD ABD 8X10 STRL (GAUZE/BANDAGES/DRESSINGS) ×2 IMPLANT
PENCIL BUTTON HOLSTER BLD 10FT (ELECTRODE) ×2 IMPLANT
SPONGE GAUZE 2X2 8PLY STRL LF (GAUZE/BANDAGES/DRESSINGS) IMPLANT
SPONGE LAP 18X18 RF (DISPOSABLE) ×4 IMPLANT
STRIP CLOSURE SKIN 1/2X4 (GAUZE/BANDAGES/DRESSINGS) IMPLANT
SUT MNCRL AB 3-0 PS2 18 (SUTURE) ×2 IMPLANT
SUT MNCRL AB 3-0 PS2 27 (SUTURE) ×2 IMPLANT
SUT MNCRL AB 4-0 PS2 18 (SUTURE) IMPLANT
SUT SILK 2 0 SH (SUTURE) ×2 IMPLANT
SUT VIC AB 2-0 SH 18 (SUTURE) ×6 IMPLANT
SUT VIC AB 2-0 SH 27 (SUTURE)
SUT VIC AB 2-0 SH 27XBRD (SUTURE) IMPLANT
SUT VIC AB 3-0 SH 18 (SUTURE) IMPLANT
SUT VIC AB 3-0 SH 27 (SUTURE)
SUT VIC AB 3-0 SH 27X BRD (SUTURE) IMPLANT
SYR 20CC LL (SYRINGE) ×2 IMPLANT
SYR BULB IRRIGATION 50ML (SYRINGE) ×2 IMPLANT
TOWEL OR 17X24 6PK STRL BLUE (TOWEL DISPOSABLE) ×4 IMPLANT
TRAY DSU PREP LF (CUSTOM PROCEDURE TRAY) IMPLANT
TUBE CONNECTING 12X1/4 (SUCTIONS) ×2 IMPLANT
WATER STERILE IRR 500ML POUR (IV SOLUTION) IMPLANT
YANKAUER SUCT BULB TIP NO VENT (SUCTIONS) ×2 IMPLANT

## 2017-12-04 NOTE — Transfer of Care (Signed)
  Last Vitals:  Vitals Value Taken Time  BP    Temp    Pulse 84 12/04/2017  3:22 PM  Resp 18 12/04/2017  3:22 PM  SpO2 100 % 12/04/2017  3:22 PM  Vitals shown include unvalidated device data.  Last Pain:  Vitals:   12/04/17 1124  TempSrc:   PainSc: 0-No pain      Patients Stated Pain Goal: 6 (12/04/17 1124)  Immediate Anesthesia Transfer of Care Note  Patient: Kyle [name]  Procedure(s) Performed: Procedure(s) (LRB): EXCISION OF PILONIDAL DISEASE (N/A)  Patient Location: PACU  Anesthesia Type: General  Level of Consciousness: awake, alert  and oriented  Airway & Oxygen Therapy: Patient Spontanous Breathing and Patient connected to nasal cannula oxygen  Post-op Assessment: Report given to PACU RN and Post -op Vital signs reviewed and stable  Post vital signs: Reviewed and stable  Complications: No apparent anesthesia complications

## 2017-12-04 NOTE — Anesthesia Preprocedure Evaluation (Addendum)
Anesthesia Evaluation  Patient identified by MRN, date of birth, ID band Patient awake    Reviewed: Allergy & Precautions, NPO status , Patient's Chart, lab work & pertinent test results  Airway Mallampati: II  TM Distance: >3 FB Neck ROM: Full    Dental  (+) Chipped,    Pulmonary Current Smoker,    Pulmonary exam normal breath sounds clear to auscultation       Cardiovascular hypertension, Pt. on medications Normal cardiovascular exam Rhythm:Regular Rate:Normal  ECG: NSR, rate 89   Neuro/Psych negative neurological ROS  negative psych ROS   GI/Hepatic negative GI ROS, Neg liver ROS,   Endo/Other  negative endocrine ROS  Renal/GU negative Renal ROS     Musculoskeletal Left sciatica   Abdominal (+) + obese,   Peds  Hematology negative hematology ROS (+)   Anesthesia Other Findings PILONIDAL DISEASE  Reproductive/Obstetrics                            Anesthesia Physical Anesthesia Plan  ASA: II  Anesthesia Plan: General   Post-op Pain Management:    Induction: Intravenous  PONV Risk Score and Plan: 1 and Midazolam, Dexamethasone, Ondansetron and Treatment may vary due to age or medical condition  Airway Management Planned: Oral ETT  Additional Equipment:   Intra-op Plan:   Post-operative Plan: Extubation in OR  Informed Consent: I have reviewed the patients History and Physical, chart, labs and discussed the procedure including the risks, benefits and alternatives for the proposed anesthesia with the patient or authorized representative who has indicated his/her understanding and acceptance.   Dental advisory given  Plan Discussed with: CRNA  Anesthesia Plan Comments:         Anesthesia Quick Evaluation

## 2017-12-04 NOTE — Op Note (Signed)
12/04/2017  3:21 PM  PATIENT:  Kyle RavelAndrew M Rowe  37 y.o. male  Patient Care Team: Andrena Mewsigby, Michael D, DO as PCP - General (Family Medicine) Karie SodaGross, Tryton Bodi, MD as Consulting Physician (General Surgery)  PRE-OPERATIVE DIAGNOSIS:  PILONIDAL DISEASE  POST-OPERATIVE DIAGNOSIS:  PILONIDAL DISEASE  PROCEDURE:  EXCISION OF PILONIDAL DISEASE  SURGEON:  Ardeth SportsmanSteven C. Jarett Dralle, MD  ASSISTANT: RN   ANESTHESIA:   local and general  EBL:  Total I/O In: 1000 [I.V.:1000] Out: 150 [Blood:150]  Delay start of Pharmacological VTE agent (>24hrs) due to surgical blood loss or risk of bleeding:  no  DRAINS:  2 WICKS (umbilical tape) IN THE LOWER BACK secured with silk suture  1.  Right =superificial SQ 2.  Left = deep   SPECIMEN:  Source of Specimen:  PILONIDAL DISEASE  DISPOSITION OF SPECIMEN:  PATHOLOGY  COUNTS:  YES  PLAN OF CARE: Discharge to home after PACU  PATIENT DISPOSITION:  PACU - hemodynamically stable.  INDICATION: Pleasant person with chronic drainage in the intergluteal space and pits suspicious for pilonidal disease.  I recommended excision with layered closure over wicks:  The anatomy of the intragluteal cleft was discussed. Pathophysiology of pilonidal disease was discussed. The importance of keeping hairs trimmed to avoid recurrence was discussed. Discussion of options such as curretage, excision with closure vs leaving open was discussed. Risks of infection with need for incision and drainage & antibiotics were discussed.  I noted a good likelihood this will help address the problem.    At this point, I think the patient would best served with considering surgery to excise the diseased tissue. I will make an attempt to close but it may need to be left open to allow it to heal with secondary intention and wound packing. Possible recurrences need reoperation or different techniques were discussed as well. I noted that recurrence is higher with poor compliance on hair removal hygiene &  overall health.  Risks of bleeding, infection, injury to other organs, need for repair of tissues / organs, need for further treatment, and other risks discussed. The patient's questions were answered. The patient agrees to proceed.  OR FINDINGS:  Intragluteal upper midline pits and lower lumbar/upper sacral chronic scarring and pustuilization consistent with chronic pilonidal disease.  Moderate dermal pustular disease and numerous pits.  No major abscess.  Layered closure with absorbable suture.  Wicks as noted above.  The right lower back wick is the superficial one.  That is the one that should be removed first in 10 days.  DESCRIPTION:   Informed consent was confirmed.  Patient underwent general anesthesia without difficulty.  She was positioned prone.  The lower back and intergluteal region was prepped and draped in the sterile fashion.  Excess air is removed.  Surgical timeout confirmed our plan.  I did examination with findings noted above.   I did a paramedian vertical fusiform excision of the pilonidal disease.  I came down to the sacrum to remove all scar tissue and sinus tracts/pilonidal disease.  Specimen removed intact.  I created deep flaps off the skin and subcutaneous tissue fascia and even superficial gluteus muscle off the sacrum 3.5 centimeters laterally on both sides to have good mobility for thick flaps.  I did copious irrigation after I had assured hemostasis.  I placed a deep wick overlying the sacrum.  I closed at the level of the gluteal fascia and muscle using interrupted 2-0 Vicryl suture to good result.  I few tacked to the sacral periosteum  to close the dead space.  I placed a wick in the superficial region.  I did another layer closure of absorbable Vicryl suture deep dermal sutures interrupted.  I then closed the skin with a running Monocryl subcuticular suture.  I then used Dermabond skin glue to seal the incision.  I had secured the wicks with silk suture.  I placed a  sterile dressing over the wicks.  Patient being extubated go to recovery room.  I had discussed postop care with the patient.  Patient had received instructions in the office.  I discussed operative findings, updated the patient's status, discussed probable steps to recovery, and gave postoperative recommendations to the patient's spouse.  Recommendations were made.  Questions were answered.  She expressed understanding & appreciation.  Pain prescription written.  [name], M.D., F.A.C.S. Gastrointestinal and Minimally Invasive Surgery [facility] [address], [address] [place], [place] [address] [phone] Main / Paging

## 2017-12-04 NOTE — Discharge Instructions (Signed)
Post Anesthesia Home Care Instructions  Activity: Get plenty of rest for the remainder of the day. A responsible individual must stay with you for 24 hours following the procedure.  For the next 24 hours, DO NOT: -Drive a car -Advertising copywriter -Drink alcoholic beverages -Take any medication unless instructed by your physician -Make any legal decisions or sign important papers.  Meals: Start with liquid foods such as gelatin or soup. Progress to regular foods as tolerated. Avoid greasy, spicy, heavy foods. If nausea and/or vomiting occur, drink only clear liquids until the nausea and/or vomiting subsides. Call your physician if vomiting continues.  Special Instructions/Symptoms: Your throat may feel dry or sore from the anesthesia or the breathing tube placed in your throat during surgery. If this causes discomfort, gargle with warm salt water. The discomfort should disappear within 24 hours.  If you had a scopolamine patch placed behind your ear for the management of post- operative nausea and/or vomiting:  1. The medication in the patch is effective for 72 hours, after which it should be removed.  Wrap patch in a tissue and discard in the trash. Wash hands thoroughly with soap and water. 2. You may remove the patch earlier than 72 hours if you experience unpleasant side effects which may include dry mouth, dizziness or visual disturbances. 3. Avoid touching the patch. Wash your hands with soap and water after contact with the patch.  Information for Discharge Teaching: EXPAREL (bupivacaine liposome injectable suspension)   Your surgeon or anesthesiologist gave you EXPAREL(bupivacaine) to help control your pain after surgery.   EXPAREL is a local anesthetic that provides pain relief by numbing the tissue around the surgical site.  EXPAREL is designed to release pain medication over time and can control pain for up to 72 hours.  Depending on how you respond to EXPAREL, you may  require less pain medication during your recovery.  Possible side effects:  Temporary loss of sensation or ability to move in the area where bupivacaine was injected.  Nausea, vomiting, constipation  Rarely, numbness and tingling in your mouth or lips, lightheadedness, or anxiety may occur.  Call your doctor right away if you think you may be experiencing any of these sensations, or if you have other questions regarding possible side effects.  Follow all other discharge instructions given to you by your surgeon or nurse. Eat a healthy diet and drink plenty of water or other fluids.  If you return to the hospital for any reason within 96 hours following the administration of EXPAREL, it is important for health care providers to know that you have received this anesthetic. A teal colored band has been placed on your arm with the date, time and amount of EXPAREL you have received in order to alert and inform your health care providers. Please leave this armband in place for the full 96 hours following administration, and then you may remove the band.   GENERAL SURGERY: POST OP INSTRUCTIONS  ######################################################################  EAT Gradually transition to a high fiber diet with a fiber supplement over the next few weeks after discharge.  Start with a pureed / full liquid diet (see below)  WALK Walk an hour a day.  Control your pain to do that.    CONTROL PAIN Control pain so that you can walk, sleep, tolerate sneezing/coughing, go up/down stairs.  HAVE A BOWEL MOVEMENT DAILY Keep your bowels regular to avoid problems.  OK to try a laxative to override constipation.  OK to use an antidairrheal  to slow down diarrhea.  Call if not better after 2 tries  CALL IF YOU HAVE PROBLEMS/CONCERNS Call if you are still struggling despite following these instructions. Call if you have concerns not answered by these  instructions  ######################################################################    1. DIET: Follow a light bland diet the first 24 hours after arrival home, such as soup, liquids, crackers, etc.  Be sure to include lots of fluids daily.  Avoid fast food or heavy meals as your are more likely to get nauseated.   2. Take your usually prescribed home medications unless otherwise directed. 3. PAIN CONTROL: a. Pain is best controlled by a usual combination of three different methods TOGETHER: i. Ice/Heat ii. Over the counter pain medication iii. Prescription pain medication b. Most patients will experience some swelling and bruising around the incisions.  Ice packs or heating pads (30-60 minutes up to 6 times a day) will help. Use ice for the first few days to help decrease swelling and bruising, then switch to heat to help relax tight/sore spots and speed recovery.  Some people prefer to use ice alone, heat alone, alternating between ice & heat.  Experiment to what works for you.  Swelling and bruising can take several weeks to resolve.   c. It is helpful to take an over-the-counter pain medication regularly for the first few weeks.  Choose one of the following that works best for you: i. Naproxen (Aleve, etc)  Two 220mg  tabs twice a day ii. Ibuprofen (Advil, etc) Three 200mg  tabs four times a day (every meal & bedtime) iii. Acetaminophen (Tylenol, etc) 500-650mg  four times a day (every meal & bedtime) d. A  prescription for pain medication (such as oxycodone, hydrocodone, etc) should be given to you upon discharge.  Take your pain medication as prescribed.  i. If you are having problems/concerns with the prescription medicine (does not control pain, nausea, vomiting, rash, itching, etc), please call us [phone] to see if we need to switch you to a different pain medicine that will work better for you and/or control your side effect better. ii. If you need a refill on your pain medication,  please contact your pharmacy.  They will contact our office to request authorization. Prescriptions will not be filled after 5 pm or on week-ends. 4. Avoid getting constipated.  Between the surgery and the pain medications, it is common to experience some constipation.  Increasing fluid intake and taking a fiber supplement (such as Metamucil, Citrucel, FiberCon, MiraLax, etc) 1-2 times a day regularly will usually help prevent this problem from occurring.  A mild laxative (prune juice, Milk of Magnesia, MiraLax, etc) should be taken according to package directions if there are no bowel movements after 48 hours.    5. Wash / shower every day.  You may shower over the dressings as they are waterproof.  Continue to shower over incision(s) after the dressing is off.    6. WOUND CARE Remove your outside bandages the next day after surgery.    If your incision ois closed, you may leave most of your lower incision (intergluteal cleft over tailbone) open to air.  You will have purple Dermabond plastic scab or skin tapes (Steri Strips) covering the incision.  Leave them on until one week, then remove.    YOU HAVE SMALL RIBBON WICKS above the upper part of your tailbone incision on your lower back that will allow the deeper part of the closed incision to drain & close down.   There  will be drainage from the wicks, usually thin & bloody.  Replace clean 4x4 gauze to cover the wicks that drain at least every day (remove dressing, shower/bathe, dry skin, replace a new gauze dressing).  Replace the dressing more often as needed Keep the area clean & dry.   You will need to come to the office to have the wicks removed by an office nurse.  Usually: -return to [facility] office 1 week from surgery to have superficial (RIGHT) wick removed by office nurse -return to [facility] office 2nd week after surgery to have the deeper (LEFT) wick removed by office nurse -return to me Dr Michaell CowingGross ~ 3 weeks to check on  incision.     7. ACTIVITIES as tolerated:   a. You may resume regular (light) daily activities beginning the next day--such as daily self-care, walking, climbing stairs--gradually increasing activities as tolerated.  If you can walk 30 minutes without difficulty, it is safe to try more intense activity such as jogging, treadmill, bicycling, low-impact aerobics, swimming, etc. b. Save the most intensive and strenuous activity for last such as sit-ups, heavy lifting, contact sports, etc  Refrain from any heavy lifting or straining until you are off narcotics for pain control.   c. DO NOT PUSH THROUGH PAIN.  Let pain be your guide: If it hurts to do something, don't do it.  Pain is your body warning you to avoid that activity for another week until the pain goes down. d. You may drive when you are no longer taking prescription pain medication, you can comfortably wear a seatbelt, and you can safely maneuver your car and apply brakes. e. Bonita QuinYou may have sexual intercourse when it is comfortable.  8. FOLLOW UP in our office a. Please call [facility] at [phone] to set up an appointment to see your surgeon in the office for a follow-up appointment approximately 7-10 days after your surgery to have one of your ribbon wicks removed by the office nurse.  The other remaining wick usually is removed the next week after that when you meet your surgeon again for a post-operative visit. b. Make sure that you call for these appointments the day you arrive home to ensure a convenient appointment time. 9. IF YOU HAVE DISABILITY OR FAMILY LEAVE FORMS, BRING THEM TO THE OFFICE FOR PROCESSING.  DO NOT GIVE THEM TO YOUR DOCTOR.   WHEN TO CALL US [phone]: 1. Poor pain control 2. Reactions / problems with new medications (rash/itching, nausea, etc)  3. Fever over 101.5 F (38.5 C) 4. Worsening swelling or bruising 5. Continued bleeding from incision. 6. Increased pain, redness, or drainage from the  incision 7. Difficulty breathing / swallowing   The clinic staff is available to answer your questions during regular business hours (8:30am-5pm).  Please dont hesitate to call and ask to speak to one of our nurses for clinical concerns.   If you have a medical emergency, go to the nearest emergency room or call 911.  A [facility] from [facility] is always on call at the [facility]   [facility] [place] [address], [address], [place], [place]  [id number] ? MAIN: [phone] ? TOLL FREE: [phone] ?  FAX [phone] [email or web address]

## 2017-12-04 NOTE — Interval H&P Note (Signed)
History and Physical Interval Note:  12/04/2017 1:17 PM  Kyle [name]  has presented today for surgery, with the diagnosis of PILONIDAL DISEASE  The various methods of treatment have been discussed with the patient and family. After consideration of risks, benefits and other options for treatment, the patient has consented to  Procedure(s): EXCISION OF PILONIDAL DISEASE (N/A) as a surgical intervention .  The patient's history has been reviewed, patient examined, no change in status, stable for surgery.  I have reviewed the patient's chart and labs.  Questions were answered to the patient's satisfaction.    I have re-reviewed the the patient's records, history, medications, and allergies.  I have re-examined the patient.  I again discussed intraoperative plans and goals of post-operative recovery.  The patient agrees to proceed.  Kyle [name]  11/04/1980 [id number]  Patient Care Team: Andrena Mewsigby, Michael D, DO as PCP - General (Family Medicine) Karie SodaGross, Mckinzy Fuller, MD as Consulting Physician (General Surgery)  Patient Active Problem List   Diagnosis Date Noted  . Class 2 obesity due to excess calories without serious comorbidity with body mass index (BMI) of 39.0 to 39.9 in adult 03/04/2017  . HTN, goal below 140/90 11/19/2015    Class: Chronic  . 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 Blood glucose elevated 11/19/2015    Past Medical History:  Diagnosis Date  . Hypertension   . Lumbar herniated disc   . Pilonidal disease   . Sciatica 2017   Left  . Wears contact lenses   . Wears glasses     Past Surgical History:  Procedure Laterality Date  . ABCESS DRAINAGE     Pilonidal cyst  . WISDOM TOOTH EXTRACTION      Social History   Socioeconomic History  . Marital status: Married    Spouse name: Not on file  . Number of children: Not on file  . Years of education: Not on file  . Highest education level: Not on file  Occupational History  . Not on file  Social Needs   . Financial resource strain: Not on file  . Food insecurity:    Worry: Not on file    Inability: Not on file  . Transportation needs:    Medical: Not on file    Non-medical: Not on file  Tobacco Use  . Smoking status: Light Tobacco Smoker    Types: Cigars  . Smokeless tobacco: Never Used  . Tobacco comment: once monthly  Substance and Sexual Activity  . Alcohol use: Yes    Alcohol/week: 0.0 standard drinks  . Drug use: No  . Sexual activity: Not on file  Lifestyle  . Physical activity:    Days per week: Not on file    Minutes per session: Not on file  . Stress: Not on file  Relationships  . Social connections:    Talks on phone: Not on file    Gets together: Not on file    Attends religious service: Not on file    Active member of club or organization: Not on file    Attends meetings of clubs or organizations: Not on file    Relationship status: Not on file  . Intimate partner violence:    Fear of current or ex partner: Not on file    Emotionally abused: Not on file    Physically abused: Not on file    Forced sexual activity: Not on file  Other Topics Concern  . Not on file  Social History Narrative  .  Not on file    Family History  Problem Relation Age of Onset  . Diabetes Father   . Hypertension Father   . Inflammatory bowel disease Sister     Medications Prior to Admission  Medication Sig Dispense Refill Last Dose  . acetaminophen (TYLENOL) 500 MG tablet Take 500 mg by mouth every 6 (six) hours as needed for mild pain. Reported on 03/08/2015   Past Month at Unknown time  . Doxycycline Hyclate 50 MG TABS Take by mouth 2 (two) times daily as needed.    11/29/2017  . hydrocortisone ointment 0.5 % Apply 1 application topically as needed for itching.   12/03/2017 at Unknown time  . lisinopril-hydrochlorothiazide (PRINZIDE,ZESTORETIC) 20-12.5 MG tablet TAKE 1 TABLET BY MOUTH DAILY. OFFICE VISIT BEFORE NEXT REFILL 90 tablet 1 12/03/2017 at Unknown time    Current  Facility-Administered Medications  Medication Dose Route Frequency Provider Last Rate Last Dose  . bupivacaine liposome (EXPAREL) 1.3 % injection 266 mg  20 mL Infiltration Once Karie Soda, MD      . ceFAZolin (ANCEF) 1,000 mg in dextrose 5 % 100 mL IVPB  1,000 mg Intravenous Once Karie Soda, MD 220 mL/hr at 12/04/17 1308 1,000 mg at 12/04/17 1308  . Chlorhexidine Gluconate Cloth 2 % PADS 6 each  6 each Topical Once Karie Soda, MD       And  . Chlorhexidine Gluconate Cloth 2 % PADS 6 each  6 each Topical Once Karie Soda, MD      . lactated ringers infusion   Intravenous Continuous Ellender, Catheryn Bacon, MD 50 mL/hr at 12/04/17 1129    . metroNIDAZOLE (FLAGYL) IVPB 500 mg  500 mg Intravenous On Call to OR Karie Soda, MD         No Known Allergies  BP (!) 140/100   Pulse 90   Temp 99.1 F (37.3 C) (Oral)   Resp 18   Ht 6\' 3"  (1.905 m)   Wt (!) 140.1 kg   SpO2 98%   BMI 38.61 kg/m   Labs: Results for orders placed or performed during the hospital encounter of 12/04/17 (from the past 48 hour(s))  I-STAT, chem 8     Status: Abnormal   Collection Time: 12/04/17 11:35 AM  Result Value Ref Range   Sodium 140 135 - 145 mmol/L   Potassium 3.9 3.5 - 5.1 mmol/L   Chloride 100 98 - 111 mmol/L   BUN 15 6 - 20 mg/dL   Creatinine, Ser 9.56 0.61 - 1.24 mg/dL   Glucose, Bld 213 (H) 70 - 99 mg/dL   Calcium, Ion 0.86 5.78 - 1.40 mmol/L   TCO2 27 22 - 32 mmol/L   Hemoglobin 15.6 13.0 - 17.0 g/dL   HCT 46.9 62.9 - 52.8 %    Imaging / Studies: No results found.   [name], M.D., F.A.C.S. Gastrointestinal and Minimally Invasive [facility] [address], [address] [place], [place] [address] [phone] Main / Paging  12/04/2017 1:17 PM    [name]

## 2017-12-04 NOTE — H&P (Signed)
Kyle [name] DOB: 09-19-1980    ` Patient sent for surgical consultation at the request of [name] with [facility] orthopedics.   Chief Complaint: Persistent pilonidal disease ` ` Pleasant big active male. 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 He has had infections on his tailbone for over 15 years. He is often had to have incision and drainage done. Sometimes even packed. Never completely resolved. He had another episode 2016 that required incision and drainage in the emergency room. Often needs oral antibiotics with it. He is now noticed a lump back there. Mentioned to his primary care physician. Surgical consultation recommended. I saw 2017. Diagnosis of pilonidal disease. Recommended surgical treatment. He held off to do work issues.  He returns 2 years later with persistent bowel disease. He also mentions he gets some rashes and acne in his inner thighs and sometimes his chest wall and armpits. Nothing too severe but can be persistent and annoying.  Patient does smoke a cigar month maybe. Has not done cigarettes in over a decade. No problems with hemorrhoids or other issues. He may have had a slightly elevated glucose but no true diagnosis of diabetes. He can walk several miles without difficulty. Usually moves his bowels once or twice a day. No personal nor family history of GI/colon cancer, inflammatory bowel disease, irritable bowel syndrome, allergy such as Celiac Sprue, dietary/dairy problems, colitis, ulcers nor gastritis. No recent sick contacts/gastroenteritis. No travel outside the country. No changes in diet. No dysphagia to solids or liquids. No significant heartburn or reflux. No hematochezia, hematemesis, coffee ground emesis. No evidence of prior gastric/peptic ulceration.  No new events.  Ready for surgery.   (Review of systems as stated in this history (HPI) or in the review of systems. Otherwise all other 12 point ROS are  negative) ` ` `   Problem List/Past Medical [name], MD; 10/06/2017 12:53 PM) PILONIDAL CYST WITHOUT INFECTION (L05.91)   Past Surgical History [name], MD; 10/06/2017 12:53 PM) Oral Surgery   Allergies [name]; 10/06/2017 12:24 PM) No Known Drug Allergies [12/11/2015]: Allergies Reconciled   Medication History [name]; 10/06/2017 12:24 PM) Lisinopril-Hydrochlorothiazide (20-12.5MG  Tablet, Oral) Active. Acetaminophen (500MG  Tablet, Oral) Active. Medications Reconciled  Social History [name], MD; 10/06/2017 12:53 PM) Alcohol use  Occasional alcohol use. Caffeine use  Coffee, Tea. No drug use  Tobacco use  Former smoker.  Family History [name], MD; 10/06/2017 12:53 PM) Diabetes Mellitus  Father. Hypertension  Father. Melanoma  Father.  Other Problems [name], MD; 10/06/2017 12:53 PM) High blood pressure     Review of Systems [name], MD; 10/06/2017 1:10 PM) General Not Present- Appetite Loss, Chills, Fatigue, Fever, Night Sweats, Weight Gain and Weight Loss. Skin Present- New Lesions. Not Present- Change in Wart/Mole, Dryness, Hives, Jaundice, Non-Healing Wounds, Rash and Ulcer. HEENT Not Present- Earache, Hearing Loss, Hoarseness, Nose Bleed, Oral Ulcers, Ringing in the Ears, Seasonal Allergies, Sinus Pain, Sore Throat, Visual Disturbances, Wears glasses/contact lenses and Yellow Eyes. Respiratory Not Present- Bloody sputum, Chronic Cough, Difficulty Breathing, Snoring and Wheezing. Breast Not Present- Breast Mass, Breast Pain, Nipple Discharge and Skin Changes. Cardiovascular Not Present- Chest Pain, Difficulty Breathing Lying Down, Leg Cramps, Palpitations, Rapid Heart Rate, Shortness of Breath and Swelling of Extremities. Gastrointestinal Not Present- Abdominal Pain, Bloating, Bloody Stool, Change in Bowel Habits, Chronic diarrhea, Constipation, Difficulty Swallowing,  Excessive gas, Gets full quickly at meals, Hemorrhoids, Indigestion, Nausea, Rectal Pain and Vomiting. Male Genitourinary Not Present-  Blood in Urine, Change in Urinary Stream, Frequency, Impotence, Nocturia, Painful Urination, Urgency and Urine Leakage. Musculoskeletal Not Present- Back Pain, Joint Pain, Joint Stiffness, Muscle Pain, Muscle Weakness and Swelling of Extremities. Neurological Not Present- Decreased Memory, Fainting, Headaches, Numbness, Seizures, Tingling, Tremor, Trouble walking and Weakness. Psychiatric Not Present- Anxiety, Bipolar, Change in Sleep Pattern, Depression, Fearful and Frequent crying. Endocrine Not Present- Cold Intolerance, Excessive Hunger, Hair Changes, Heat Intolerance and New Diabetes. Hematology Not Present- Blood Thinners, Easy Bruising, Excessive bleeding, Gland problems, HIV and Persistent Infections.  Vitals [name]; 10/06/2017 12:25 PM) 10/06/2017 12:24 PM Weight: 315.4 lb Height: 75in Body Surface Area: 2.66 m Body Mass Index: 39.42 kg/m  Pain Level: 5/10 Temp.: 19F(Temporal)  Pulse: 104 (Regular)  P.OX: 97% (Room air) BP: 130/88 (Sitting, Left Arm, Standard) Left Heel Pain  BP (!) 140/100   Pulse 90   Temp 99.1 F (37.3 C) (Oral)   Resp 18   Ht 6\' 3"  (1.905 m)   Wt (!) 140.1 kg   SpO2 98%   BMI 38.61 kg/m      Physical Exam [name] MD; 10/06/2017 1:03 PM) General Mental Status-Alert. General Appearance-Not in acute distress, Not Sickly. Orientation-Oriented X3. Hydration-Well hydrated. Voice-Normal.  Integumentary Global Assessment Normal Exam - Axillae: non-tender, no inflammation or ulceration, no drainage. and Distribution of scalp and body hair is normal. General Characteristics Temperature - normal warmth is noted. Note: Mild folliculitis along the left lateral chest wall. Axillae rather spared. Inguinal regions rather spared. Some evidence of old folliculitis and inner  left thigh but no active folliculitis right now.   Head and Neck Head-normocephalic, atraumatic with no lesions or palpable masses. Face Global Assessment - atraumatic, no absence of expression. Neck Global Assessment - no abnormal movements, no bruit auscultated on the right, no bruit auscultated on the left, no decreased range of motion, non-tender. Trachea-midline. Thyroid Gland Characteristics - non-tender.  Eye Eyeball - Left-Extraocular movements intact, No Nystagmus. Eyeball - Right-Extraocular movements intact, No Nystagmus. Cornea - Left-No Hazy. Cornea - Right-No Hazy. Sclera/Conjunctiva - Left-No scleral icterus, No Discharge. Sclera/Conjunctiva - Right-No scleral icterus, No Discharge. Pupil - Left-Direct reaction to light normal. Pupil - Right-Direct reaction to light normal.  ENMT Ears Pinna - Left - no drainage observed, no generalized tenderness observed. Right - no drainage observed, no generalized tenderness observed. Nose and Sinuses Nose - no destructive lesion observed. Nares - Left - quiet respiration. Right - quiet respiration. Mouth and Throat Lips - Upper Lip - no fissures observed, no pallor noted. Lower Lip - no fissures observed, no pallor noted. Nasopharynx - no discharge present. Oral Cavity/Oropharynx - Tongue - no dryness observed. Oral Mucosa - no cyanosis observed. Hypopharynx - no evidence of airway distress observed.  Chest and Lung Exam Inspection Movements - Normal and Symmetrical. Accessory muscles - No use of accessory muscles in breathing. Palpation Normal exam - Non-tender. Auscultation Breath sounds - Normal and Clear.  Cardiovascular Auscultation Rhythm - Regular. Murmurs & Other Heart Sounds - Normal exam - No Murmurs and No Systolic Clicks.  Abdomen Inspection Normal Exam - No Visible peristalsis and No Abnormal pulsations. Umbilicus - No Bleeding, No Urine drainage. Palpation/Percussion Normal  exam - Soft, Non Tender, No Rebound tenderness, No Rigidity (guarding) and No Cutaneous hyperesthesia. Note: Abdomen obese but soft. No diastases recti. Nontender, nondistended. No guarding. No umbilical no other hernias   Male Genitourinary Sexual Maturity Tanner 5 - Adult hair pattern and Adult penile size and shape. Note:  No inguinal hernias. Normal external genitalia. Epididymi, testes, and spermatic cords normal without any masses.   Rectal Note: 2 x 1 cm purple hypertrophic nodule at right apex of intergluteal cleft consistent with probable chronic cystic granulation tissue. A few pits in the midline. Consistent with chronic pilonidal disease. No active cellulitis or abscess. Similar to 2017  Perianal skin clean with good hygiene. No pruritis ani. No fissure. No abscess/fistula. Normal sphincter tone. No external hemorrhoids.   Peripheral Vascular Upper Extremity Inspection - Left - No Cyanotic nailbeds, Not Ischemic. Right - No Cyanotic nailbeds, Not Ischemic.  Neurologic Neurologic evaluation reveals -normal attention span and ability to concentrate, able to name objects and repeat phrases. Appropriate fund of knowledge , normal sensation and normal coordination. Mental Status Affect - not angry, not paranoid. Cranial Nerves-Normal Bilaterally. Gait-Normal.  Neuropsychiatric Mental status exam performed with findings of-able to articulate well with normal speech/language, rate, volume and coherence, thought content normal with ability to perform basic computations and apply abstract reasoning and no evidence of hallucinations, delusions, obsessions or homicidal/suicidal ideation.  Musculoskeletal Global Assessment Spine, Ribs and Pelvis - no instability, subluxation or laxity. Right Upper Extremity - no instability, subluxation or laxity.  Lymphatic Head & Neck General Head & Neck Lymphatics: Bilateral - Description - No Localized  lymphadenopathy. Axillary General Axillary Region: Bilateral - Description - No Localized lymphadenopathy. Femoral & Inguinal Generalized Femoral & Inguinal Lymphatics: Left: Right - Description - No Localized lymphadenopathy. Description - No Localized lymphadenopathy.    Assessment & Plan ( PILONIDAL CYST WITHOUT INFECTION (L05.91) Impression: Very classic history and physical for pilonidal disease. He does have a hypertrophic nodule. It doesn't look like a skin cancer but nonetheless I would definitely get rid of that region.  He's interested in proceeding. We'll work to coordinate a convenient time   The anatomy of the intragluteal cleft was discussed. Pathophysiology of pilonidal disease was discussed. The importance of keeping hairs trimmed to avoid recurrence was discussed. Discussion of options such as curretage, excision with closure vs leaving open was discussed. Risks of infection with need for incision and drainage & antibiotics were discussed. I noted a good likelihood this will help address the problem.  At this point, I think the patient would best served with considering surgery to excise the diseased tissue. I will make an attempt to close but it may need to be left open to allow it to heal with secondary intention and wound packing. Possible recurrences need reoperation or different techniques were discussed as well. I noted that recurrence is higher with poor compliance on hair removal hygiene & overall health. The patient's questions were answered. The patient agrees to proceed.  FOLLICULITIS OF PERINEUM (L73.9) Impression: Intermittent flares of folliculitis in inner thigh/perineum and chest wall. Not to the point of progressing to hydradenitis.  I think he has a good chance of not need anything more than continued good hygiene and occasional doxycycline. We'll provide a prescription with probable refills Current Plans Started Doxycycline Hyclate 100 MG Oral  Capsule, 1 (one) Capsule two times daily, #50, 25 days starting 10/06/2017, Ref. x2. FOLLICULITIS OF AXILLA (L73.9)     Ardeth SportsmanSteven C. Cybele Maule, MD, FACS, MASCRS Gastrointestinal and Minimally Invasive Surgery    [address] [place] [id number] [phone] Main / Paging [phone] Fax

## 2017-12-04 NOTE — Anesthesia Procedure Notes (Signed)
Procedure Name: Intubation Date/Time: 12/04/2017 1:43 PM Performed by: Murvin Natal, MD Pre-anesthesia Checklist: Patient identified, Emergency Drugs available, Suction available and Patient being monitored Patient Re-evaluated:Patient Re-evaluated prior to induction Oxygen Delivery Method: Circle system utilized Preoxygenation: Pre-oxygenation with 100% oxygen Induction Type: IV induction Ventilation: Mask ventilation without difficulty Laryngoscope Size: Mac and 4 Grade View: Grade II Tube type: Oral Number of attempts: 2 Airway Equipment and Method: Stylet Placement Confirmation: ETT inserted through vocal cords under direct vision,  positive ETCO2 and breath sounds checked- equal and bilateral Secured at: 23 cm Tube secured with: Tape Dental Injury: Teeth and Oropharynx as per pre-operative assessment  Comments: DL times 2.  DL 1, lost view. Repositioned head and applied cricoid pressure.  AOI on DL 2

## 2017-12-06 NOTE — Anesthesia Postprocedure Evaluation (Signed)
Anesthesia Post Note  Patient: Kyle [name]  Procedure(s) Performed: EXCISION OF PILONIDAL DISEASE (N/A Coccyx)     Patient location during evaluation: PACU Anesthesia Type: General Level of consciousness: awake and alert Pain management: pain level controlled Vital Signs Assessment: post-procedure vital signs reviewed and stable Respiratory status: spontaneous breathing, nonlabored ventilation, respiratory function stable and patient connected to nasal cannula oxygen Cardiovascular status: blood pressure returned to baseline and stable Postop Assessment: no apparent nausea or vomiting Anesthetic complications: no    Last Vitals:  Vitals:   12/04/17 1630 12/04/17 1715  BP: 134/70 (!) 135/96  Pulse: 86 90  Resp: 14 16  Temp: 36.8 C 37.4 C  SpO2: 100% 100%    Last Pain:  Vitals:   12/04/17 1715  TempSrc:   PainSc: 2                  Clarise Chacko P Brandt Chaney

## 2017-12-07 ENCOUNTER — Encounter (HOSPITAL_BASED_OUTPATIENT_CLINIC_OR_DEPARTMENT_OTHER): Payer: Self-pay | Admitting: Surgery

## 2018-03-02 DIAGNOSIS — M5116 Intervertebral disc disorders with radiculopathy, lumbar region: Secondary | ICD-10-CM | POA: Diagnosis not present

## 2018-03-02 DIAGNOSIS — M545 Low back pain: Secondary | ICD-10-CM | POA: Diagnosis not present

## 2018-03-02 DIAGNOSIS — M5126 Other intervertebral disc displacement, lumbar region: Secondary | ICD-10-CM | POA: Insufficient documentation

## 2018-03-02 MED FILL — METHYLPREDNISOLONE 4 MG TAB: 4 | 6 days supply | Qty: 21 | Fill #0

## 2018-03-02 MED FILL — LISINOPRIL-HCTZ 20-12.5 MG: 20-12.5 | 60 days supply | Qty: 60 | Fill #1

## 2018-03-02 MED FILL — GABAPENTIN 300 MG CAPSULE: 300 | 10 days supply | Qty: 30 | Fill #0

## 2018-03-04 DIAGNOSIS — M5116 Intervertebral disc disorders with radiculopathy, lumbar region: Secondary | ICD-10-CM | POA: Diagnosis not present

## 2018-03-05 DIAGNOSIS — M5416 Radiculopathy, lumbar region: Secondary | ICD-10-CM | POA: Diagnosis not present

## 2018-03-16 ENCOUNTER — Other Ambulatory Visit: Payer: Self-pay | Admitting: Orthopedic Surgery

## 2018-03-16 DIAGNOSIS — M5416 Radiculopathy, lumbar region: Secondary | ICD-10-CM

## 2018-03-18 ENCOUNTER — Ambulatory Visit
Admission: RE | Admit: 2018-03-18 | Discharge: 2018-03-18 | Disposition: A | Discharge status: Home / Self Care | Payer: 59 | Source: Ambulatory Visit | Attending: Orthopedic Surgery | Admitting: Orthopedic Surgery

## 2018-03-18 DIAGNOSIS — M5416 Radiculopathy, lumbar region: Secondary | ICD-10-CM

## 2018-03-18 DIAGNOSIS — M5117 Intervertebral disc disorders with radiculopathy, lumbosacral region: Secondary | ICD-10-CM | POA: Diagnosis not present

## 2018-03-18 MED ORDER — IOPAMIDOL (ISOVUE-M 300) INJECTION 61%
1.0000 mL | Freq: Once | INTRAMUSCULAR | Status: AC | PRN
  Administered 2018-03-18: 1 mL via EPIDURAL

## 2018-03-18 MED ORDER — METHYLPREDNISOLONE ACETATE 40 MG/ML INJ SUSP (RADIOLOG
120.0000 mg | Freq: Once | INTRAMUSCULAR | Status: AC
  Administered 2018-03-18: 120 mg via EPIDURAL

## 2018-03-18 NOTE — Discharge Instructions (Signed)

## 2018-03-24 ENCOUNTER — Other Ambulatory Visit: Payer: Self-pay | Admitting: Sports Medicine

## 2018-03-31 ENCOUNTER — Other Ambulatory Visit: Payer: Self-pay | Admitting: Sports Medicine

## 2018-04-02 DIAGNOSIS — M5136 Other intervertebral disc degeneration, lumbar region: Secondary | ICD-10-CM | POA: Diagnosis not present

## 2018-04-02 DIAGNOSIS — M5116 Intervertebral disc disorders with radiculopathy, lumbar region: Secondary | ICD-10-CM | POA: Diagnosis not present

## 2018-04-05 ENCOUNTER — Encounter: Payer: Self-pay | Admitting: Sports Medicine

## 2018-04-05 ENCOUNTER — Other Ambulatory Visit: Payer: Self-pay

## 2018-04-05 ENCOUNTER — Ambulatory Visit: Payer: 59 | Admitting: Sports Medicine

## 2018-04-05 ENCOUNTER — Ambulatory Visit: Payer: Self-pay | Admitting: Orthopedic Surgery

## 2018-04-05 VITALS — BP 110/86 | HR 114 | Temp 97.6°F | Ht 75.0 in | Wt 297.4 lb

## 2018-04-05 DIAGNOSIS — E1169 Type 2 diabetes mellitus with other specified complication: Secondary | ICD-10-CM

## 2018-04-05 DIAGNOSIS — E119 Type 2 diabetes mellitus without complications: Secondary | ICD-10-CM

## 2018-04-05 DIAGNOSIS — M5126 Other intervertebral disc displacement, lumbar region: Secondary | ICD-10-CM

## 2018-04-05 DIAGNOSIS — E6609 Other obesity due to excess calories: Secondary | ICD-10-CM

## 2018-04-05 DIAGNOSIS — Z6839 Body mass index (BMI) 39.0-39.9, adult: Secondary | ICD-10-CM

## 2018-04-05 DIAGNOSIS — R739 Hyperglycemia, unspecified: Secondary | ICD-10-CM

## 2018-04-05 DIAGNOSIS — E785 Hyperlipidemia, unspecified: Secondary | ICD-10-CM

## 2018-04-05 DIAGNOSIS — I10 Essential (primary) hypertension: Secondary | ICD-10-CM

## 2018-04-05 DIAGNOSIS — M5136 Other intervertebral disc degeneration, lumbar region: Secondary | ICD-10-CM | POA: Insufficient documentation

## 2018-04-05 LAB — CBC WITH DIFFERENTIAL/PLATELET
Basophils Absolute: 0 10*3/uL (ref 0.0–0.1)
Basophils Relative: 0.5 % (ref 0.0–3.0)
Eosinophils Absolute: 0 10*3/uL (ref 0.0–0.7)
Eosinophils Relative: 0.5 % (ref 0.0–5.0)
HEMATOCRIT: 50.3 % (ref 39.0–52.0)
Hemoglobin: 17.4 g/dL — ABNORMAL HIGH (ref 13.0–17.0)
LYMPHS ABS: 2.9 10*3/uL (ref 0.7–4.0)
LYMPHS PCT: 31.9 % (ref 12.0–46.0)
MCHC: 34.6 g/dL (ref 30.0–36.0)
MCV: 88.3 fl (ref 78.0–100.0)
Monocytes Absolute: 0.6 10*3/uL (ref 0.1–1.0)
Monocytes Relative: 6.7 % (ref 3.0–12.0)
NEUTROS ABS: 5.5 10*3/uL (ref 1.4–7.7)
NEUTROS PCT: 60.4 % (ref 43.0–77.0)
Platelets: 258 10*3/uL (ref 150.0–400.0)
RBC: 5.7 Mil/uL (ref 4.22–5.81)
RDW: 14.3 % (ref 11.5–15.5)
WBC: 9.2 10*3/uL (ref 4.0–10.5)

## 2018-04-05 LAB — COMPREHENSIVE METABOLIC PANEL
ALK PHOS: 59 U/L (ref 39–117)
ALT: 45 U/L (ref 0–53)
AST: 22 U/L (ref 0–37)
Albumin: 4.9 g/dL (ref 3.5–5.2)
BUN: 14 mg/dL (ref 6–23)
CO2: 29 mEq/L (ref 19–32)
Calcium: 10.3 mg/dL (ref 8.4–10.5)
Chloride: 98 mEq/L (ref 96–112)
Creatinine, Ser: 1.06 mg/dL (ref 0.40–1.50)
GFR: 78.48 mL/min (ref >60.00)
Glucose, Bld: 107 mg/dL — ABNORMAL HIGH (ref 70–99)
Potassium: 4 mEq/L (ref 3.5–5.1)
Sodium: 136 mEq/L (ref 135–145)
TOTAL PROTEIN: 7.5 g/dL (ref 6.0–8.3)
Total Bilirubin: 0.5 mg/dL (ref 0.2–1.2)

## 2018-04-05 LAB — HEMOGLOBIN A1C: Hgb A1c MFr Bld: 7.2 % — ABNORMAL HIGH (ref 4.6–6.5)

## 2018-04-05 MED ORDER — LISINOPRIL-HYDROCHLOROTHIAZIDE 20-12.5 MG PO TABS: 1.0000 | ORAL_TABLET | Freq: Every day | ORAL | 3 refills | Status: DC

## 2018-04-05 NOTE — H&P (Signed)
Subjective:   Kyle [name] is a pleasant 38 year old male With no significant past medical history except for a previous L4/L5 disc herniation that was managed conservatively with injection therapy He has been experiencing low back pain with radicular posterior thigh and lower leg pain and dysesthesias that are both severe and debilitating. Patient also reports left calf weakness. Patient has trialed gabapentin, but does not tolerate it well. Patient is currently managing his pain with Tylenol, Advil, and has had a left S1 selective nerve root block; unfortunately, this was not significantly helpful and the patient continues to have severe debilitating pain, calf weakness, and decreased quality-of-life. At this point time, he would like to move forward with an L5/S1 discectomy with Dr. Shon Baton at [facility].  Patient Active Problem List   Diagnosis Date Noted  . 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 Class 2 obesity due to excess calories without serious comorbidity with body mass index (BMI) of 39.0 to 39.9 in adult 03/04/2017  . HTN, goal below 140/90 11/19/2015    Class: Chronic  . Pilonidal disease s/p excision 12/04/2017 11/19/2015  . Blood glucose elevated 11/19/2015   Past Medical History:  Diagnosis Date  . Hypertension   . Lumbar herniated disc   . Pilonidal disease   . Sciatica 2017   Left  . Wears contact lenses   . Wears glasses     Past Surgical History:  Procedure Laterality Date  . ABCESS DRAINAGE     Pilonidal cyst  . PILONIDAL CYST EXCISION N/A 12/04/2017   Procedure: EXCISION OF PILONIDAL DISEASE;  Surgeon: Karie Soda, MD;  Location: [facility];  Service: General;  Laterality: N/A;  . WISDOM TOOTH EXTRACTION      Current Outpatient Medications  Medication Sig Dispense Refill Last Dose  . acetaminophen (TYLENOL) 500 MG tablet Take 1,000 mg by mouth every 6 (six) hours as needed for mild pain.     Past Month at Unknown time  . gabapentin (NEURONTIN) 300 MG capsule Take 1 capsule (300 mg total) by mouth 2 (two) times daily. Increase to 4x/day as needed (Patient not taking: Reported on 04/02/2018) 60 capsule 1 Not Taking at Unknown time  . hydrocortisone cream 1 % Apply 1 application topically 2 (two) times daily as needed for itching (dry skin/redness/irritation.).     Marland Kitchen ibuprofen (ADVIL,MOTRIN) 200 MG tablet Take 400 mg by mouth every 8 (eight) hours as needed (for pain.).     Marland Kitchen lisinopril-hydrochlorothiazide (PRINZIDE,ZESTORETIC) 20-12.5 MG tablet TAKE 1 TABLET BY MOUTH ONCE DAILY - office visit due before next refill (Patient taking differently: Take 1 tablet by mouth daily. TAKE 1 TABLET BY MOUTH ONCE DAILY - office visit due before next refill) 90 tablet 0   . Multiple Vitamin (MULTIVITAMIN WITH MINERALS) TABS tablet Take 2 tablets by mouth 2 (two) times a week.     . traMADol (ULTRAM) 50 MG tablet Take 1-2 tablets (50-100 mg total) by mouth every 6 (six) hours as needed for moderate pain or severe pain. (Patient not taking: Reported on 04/02/2018) 20 tablet 0 Not Taking at Unknown time   No current facility-administered medications for this visit.    No Known Allergies  Social History   Tobacco Use  . Smoking status: Light Tobacco Smoker    Types: Cigars  . Smokeless tobacco: Never Used  . Tobacco comment: once monthly  Substance Use Topics  . Alcohol use: Yes    Alcohol/week: 0.0 standard drinks  Family History  Problem Relation Age of Onset  . Diabetes Father   . Hypertension Father   . Inflammatory bowel disease Sister     Review of Systems As stated in HPI  Objective:   Vitals:  Ht: 6 ft 3 in 04/02/2018 08:15 am  Wt: 305 lbs 04/02/2018 08:18 am  BMI: 38.1 04/02/2018 08:18 am  Pain Scale: 4 04/02/2018 08:18 am  General: AAOX3, well developed and well nourished, standing/pacing unable to find comfortable position Ambulation antalgic has left gastroc weakness;  uses no assitive devices Heart: RRR, no rubs, murmers, or gallops. Lungs: CTAB Abdomen: Normal bowel soundsX4, soft, non-tender, no hepatosplenomegaly.  ROM: -Spine: normal ROM, pain elicited with fwd flexion and extension. - Knee: flexion and extension normal and pain free bilaterally. - Ankle: Dorsiflexion, plantarflexion, inversion, eversion normal and pain free.  Dermatomes: Lower extremity sensation to light touch abnormal with positive dysathesias on the left S1 dermatome.  Myotomes: - Hip Flexion: Left 5/5, Right 5/5 -Hip Adduction: Left 5/5, Right 5/5 - Knee Extension: Left 5/5, Right 5/5 - Knee Flexion: Left 5/5, Right 5/5 - Ankle Dorsiflextion: Left 5/5, Right 5/5 - Ankle Eversion: Left 4-/5, Right 5/5 - Ankle Plantarflexion: Left 4-/5, Right 5/5  Reflexes: - Patella: Left2+, Right 2+ - Achilles: Left2+, Right 2+ - Babinski: Left Ngative, Right Negative - Clonus: Negative  Special Tests: - Straight Leg Raise: Left Positive, Right Negative  PV: Extremities warm and well profused. Posterior and dorsalis pedis pulse 2+ bilaterally, No pitting Edema, discoloration, calf tenderness, or palpable cords. Homan's negative bilaterally.  X-Ray impression: No x-rays at today's visit  Lumbar MRI: completed on 03/04/18 was reviewed with the patient. I have also reviewed the radiology report. Patient has a large central and left-sided disc herniation at L5-S1 with compression of the traversing S1 nerve root. Continues to have the small central and slightly to the right disc  Assessment:   Kyle [name] is a very pleasant young man who returns today status post S1 selective nerve root block. Unfortunately the pain and weakness did not improve after the injection. Imaging studies confirm the L5-S1 disc herniation to the left side with compression of the left S1 nerve root. Clinical exam is consistent with radiculopathy with a positive nerve root tension sign, motor and sensory deficits in the  S1 dermatome.  Plan:    As a result of the failure of conservative treatment and the neurological deficits we are moving forward with surgery. I discussed the surgical procedure in detail with the patient and all of his questions were addressed.  Risks and benefits of surgery were discussed with the patient. These include: Infection, bleeding, death, stroke, paralysis, ongoing or worse pain, need for additional surgery, leak of spinal fluid, adjacent segment degeneration requiring additional surgery, post-operative hematoma formation that can result in neurological compromise and the need for urgent/emergent re-operation. Loss in bowel and bladder control. Injury to major vessels that could result in the need for urgent abdominal surgery to stop bleeding. Risk of deep venous thrombosis (DVT) and the need for additional treatment. Recurrent disc herniation resulting in the need for revision surgery, which could include fusion surgery (utilizing instrumentation such as pedicle screws and intervertebral cages).  Plan on moving forward with surgery in a timely fashion. We will obtain medical clearance. We will fit him for his LSO brace today to avoid having to return to the office.

## 2018-04-05 NOTE — Progress Notes (Signed)
Kyle [name]. [name] [phone]  Kyle RUTKOSKI - 38 y.o. male MRN [id number]  Date of birth: 11-29-1980  Visit Date: April 05, 2018  PCP: Andrena Mews, DO   Referred by: Andrena Mews, DO  SUBJECTIVE:   Chief Complaint  Patient presents with  . Lower Back - Follow-up    Had TF epidural inj 03/18/2018. Scheduled for left L5-S1 discectomy with Dr. Shon Baton 04/08/2018.   . surgical clearance    HPI: Patient is here for preop clearance for L5-S1 discectomy as above.  He has weakness and muscle atrophy in the S1 distribution and is scheduled for nonelective discectomy on 04/08/2018: He is otherwise been in good health and is actually made positive changes including a 20 pound weight loss.  He has been diligent with his home medication regimen for his blood pressure.  He has had issues with tachycardia in the past but denies any tachypalpitations, or orthopnea, dyspnea on exertion or general shortness of breath.  No significant acid reflux-like symptoms.  REVIEW OF SYSTEMS: No significant nighttime awakenings due to this issue. Denies fevers, chills, recent weight gain or weight loss.  No night sweats.  Pt denies any change in bowel or bladder habits, muscle weakness, numbness or falls associated with this pain.  HISTORY:  Prior history reviewed and updated per electronic medical record.  Patient Active Problem List   Diagnosis Date Noted  . Lumbar herniated disc 04/05/2018  . 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 Class 2 obesity due to excess calories without serious comorbidity with body mass index (BMI) of 39.0 to 39.9 in adult 03/04/2017  . HTN, goal below 140/90 11/19/2015    Class: Chronic  . Pilonidal disease s/p excision 12/04/2017 11/19/2015  . Blood glucose elevated 11/19/2015   Social History   Occupational History  . Not on file  Tobacco Use  .  Smoking status: Former Smoker    Types: Cigars    Last attempt to quit: 10/13/2017    Years since quitting: 0.4  . Smokeless tobacco: Never Used  . Tobacco comment: once monthly  Substance and Sexual Activity  . Alcohol use: Yes    Alcohol/week: 0.0 standard drinks  . Drug use: No  . Sexual activity: Not on file   Social History   Social History Narrative  . Not on file   Past Medical History:  Diagnosis Date  . Hypertension   . Lumbar herniated disc   . Pilonidal disease   . Sciatica 2017   Left  . Wears contact lenses   . Wears glasses    Past Surgical History:  Procedure Laterality Date  . ABCESS DRAINAGE     Pilonidal cyst  . PILONIDAL CYST EXCISION N/A 12/04/2017   Procedure: EXCISION OF PILONIDAL DISEASE;  Surgeon: Karie Soda, MD;  Location: [facility];  Service: General;  Laterality: N/A;  . WISDOM TOOTH EXTRACTION     family history includes Diabetes in his father; Hypertension in his father; Inflammatory bowel disease in his sister.  OBJECTIVE:  VS:  HT:6\' 3"  (190.5 cm)   WT:297 lb 6.4 oz (134.9 kg)  BMI:37.17    BP:110/86  HR:(!) 114bpm  TEMP:97.6 F (36.4 C)(Oral)  RESP:98 %   PHYSICAL EXAM: Adult male. No acute distress.  Alert and appropriate. His heart and lung exam reveals a slight tachycardia but otherwise no murmur or abnormal sounds.  His lungs are clear to auscultation bilaterally.  He has good thoracic excursion.  Abdomen soft, nontender but obese.  He has marked pain with straight leg raise on the left.  He is unable to toe walk on the left leg due to weakness.   ASSESSMENT:   1. HTN, goal below 140/90   2. Lumbar herniated disc   3. Blood glucose elevated   4. Class 2 obesity due to excess calories without serious comorbidity with body mass index (BMI) of 39.0 to 39.9 in adult     PROCEDURES:  None  PLAN:  Pertinent additional documentation may be included in corresponding procedure notes, imaging studies, problem  based documentation and patient instructions.  No problem-specific Assessment & Plan notes found for this encounter.   Routine healthcare maintenance labs obtained today.  Given the elevated glucoses on recent blood work we will go and check an A1c for confirmation.  Continue with healthy lifestyle changes and at this time I have no contraindications for him undergoing routine spine procedure.  Ultimately he is aware of the COVID-19 crisis but given the significant findings on his physical exam and lack of improvement with gabapentin, epidural steroid injections I do support that delaying surgery could result in prolonged and permanent damage.  I will defer to Dr. Shon Baton for ongoing management following this.   Activity modifications and the importance of avoiding exacerbating activities (limiting pain to no more than a 4 / 10 during or following activity) recommended and discussed.   Discussed red flag symptoms that warrant earlier emergent evaluation and patient voices understanding.    Meds ordered this encounter  Medications  . lisinopril-hydrochlorothiazide (PRINZIDE,ZESTORETIC) 20-12.5 MG tablet    Sig: Take 1 tablet by mouth daily.    Dispense:  90 tablet    Refill:  3    Office visit required before next refill    Lab Orders     Lipid Panel w/reflex Direct LDL     CBC with Differential/Platelet     Comprehensive metabolic panel     Hemoglobin H0T Imaging Orders  No imaging studies ordered today   Referral Orders  No referral(s) requested today    Return if symptoms worsen or fail to improve.          Andrena Mews, DO    [facility]

## 2018-04-06 LAB — LIPID PANEL W/REFLEX DIRECT LDL
Cholesterol: 215 mg/dL — ABNORMAL HIGH (ref <200)
HDL: 39 mg/dL — ABNORMAL LOW (ref >=40)
Non-HDL Cholesterol (Calc): 176 mg/dL (calc) — ABNORMAL HIGH (ref <130)
Total CHOL/HDL Ratio: 5.5 (calc) — ABNORMAL HIGH (ref <5.0)
Triglycerides: 401 mg/dL — ABNORMAL HIGH (ref <150)

## 2018-04-06 LAB — DIRECT LDL: Direct LDL: 143 mg/dL — ABNORMAL HIGH (ref <100)

## 2018-04-06 NOTE — Pre-Procedure Instructions (Signed)
BERVIN KRONINGER  04/06/2018      [facility] - [place], [place] - [address] [place] [address] [place] [address] Phone: [phone] Fax: [phone]    Your procedure is scheduled on Thursday, March 26th.  Report to [facility] Entrance "A" Admitting at 5:30 A.M.  Call this number if you have problems the morning of surgery:  [phone]   Remember:  Do not eat or drink after midnight.    Take these medicines the morning of surgery with A SIP OF WATER  As needed:  acetaminophen (TYLENOL), traMADol (ULTRAM).   As of today, STOP taking any Aspirin (unless otherwise instructed by your surgeon), Aleve, Naproxen, Ibuprofen, Motrin, Advil, Goody's, BC's, all herbal medications, fish oil, and all vitamins.      Do not wear jewelry.  Do not wear lotions, powders, or colognes, or deodorant.  Men may shave face and neck.  Do not bring valuables to the hospital.  [facility] is not responsible for any belongings or valuables.  Contacts, dentures or bridgework may not be worn into surgery.  Leave your suitcase in the car.  After surgery it may be brought to your room.  For patients admitted to the hospital, discharge time will be determined by your treatment team.  Patients discharged the day of surgery will not be allowed to drive home.   Special instructions:   [place]- Preparing For Surgery  Before surgery, you can play an important role. Because skin is not sterile, your skin needs to be as free of germs as possible. You can reduce the number of germs on your skin by washing with CHG (chlorahexidine gluconate) Soap before surgery.  CHG is an antiseptic cleaner which kills germs and bonds with the skin to continue killing germs even after washing.    Oral Hygiene is also important to reduce your risk of infection.  Remember - BRUSH YOUR TEETH THE MORNING OF SURGERY WITH YOUR REGULAR TOOTHPASTE  Please do not use if you have an  allergy to CHG or antibacterial soaps. If your skin becomes reddened/irritated stop using the CHG.  Do not shave (including legs and underarms) for at least 48 hours prior to first CHG shower. It is OK to shave your face.  Please follow these instructions carefully.   1. Shower the NIGHT BEFORE SURGERY and the MORNING OF SURGERY with CHG.   2. If you chose to wash your hair, wash your hair first as usual with your normal shampoo.  3. After you shampoo, rinse your hair and body thoroughly to remove the shampoo.  4. Use CHG as you would any other liquid soap. You can apply CHG directly to the skin and wash gently with a scrungie or a clean washcloth.   5. Apply the CHG Soap to your body ONLY FROM THE NECK DOWN.  Do not use on open wounds or open sores. Avoid contact with your eyes, ears, mouth and genitals (private parts). Wash Face and genitals (private parts)  with your normal soap.  6. Wash thoroughly, paying special attention to the area where your surgery will be performed.  7. Thoroughly rinse your body with warm water from the neck down.  8. DO NOT shower/wash with your normal soap after using and rinsing off the CHG Soap.  9. Pat yourself dry with a CLEAN TOWEL.  10. Wear CLEAN PAJAMAS to bed the night before surgery, wear comfortable clothes the morning of surgery  11.  Place CLEAN SHEETS on your bed the night of your first shower and DO NOT SLEEP WITH PETS.    Day of Surgery:  Do not apply any deodorants/lotions.  Please wear clean clothes to the hospital/surgery center.   Remember to brush your teeth WITH YOUR REGULAR TOOTHPASTE.  Please read over the following fact sheets that you were given.

## 2018-04-07 ENCOUNTER — Encounter (HOSPITAL_COMMUNITY)
Admission: RE | Admit: 2018-04-07 | Discharge: 2018-04-07 | Disposition: A | Discharge status: Home / Self Care | Payer: 59 | Source: Ambulatory Visit | Attending: Orthopedic Surgery | Admitting: Orthopedic Surgery

## 2018-04-07 ENCOUNTER — Encounter (HOSPITAL_COMMUNITY): Payer: Self-pay

## 2018-04-07 ENCOUNTER — Other Ambulatory Visit: Payer: Self-pay

## 2018-04-07 DIAGNOSIS — Z01818 Encounter for other preprocedural examination: Secondary | ICD-10-CM | POA: Insufficient documentation

## 2018-04-07 DIAGNOSIS — I10 Essential (primary) hypertension: Secondary | ICD-10-CM | POA: Insufficient documentation

## 2018-04-07 DIAGNOSIS — M2578 Osteophyte, vertebrae: Secondary | ICD-10-CM | POA: Diagnosis not present

## 2018-04-07 DIAGNOSIS — Z87891 Personal history of nicotine dependence: Secondary | ICD-10-CM | POA: Diagnosis not present

## 2018-04-07 DIAGNOSIS — M79605 Pain in left leg: Secondary | ICD-10-CM | POA: Diagnosis present

## 2018-04-07 DIAGNOSIS — E669 Obesity, unspecified: Secondary | ICD-10-CM | POA: Diagnosis not present

## 2018-04-07 DIAGNOSIS — M5117 Intervertebral disc disorders with radiculopathy, lumbosacral region: Secondary | ICD-10-CM | POA: Diagnosis not present

## 2018-04-07 DIAGNOSIS — Z6836 Body mass index (BMI) 36.0-36.9, adult: Secondary | ICD-10-CM | POA: Diagnosis not present

## 2018-04-07 LAB — SURGICAL PCR SCREEN
MRSA, PCR: NEGATIVE
Staphylococcus aureus: NEGATIVE

## 2018-04-07 MED ORDER — DEXTROSE 5 % IV SOLN
3.0000 g | INTRAVENOUS | Status: AC
  Administered 2018-04-08: 3 g via INTRAVENOUS
  Filled 2018-04-07: qty 3

## 2018-04-07 MED ORDER — TRANEXAMIC ACID-NACL 1000-0.7 MG/100ML-% IV SOLN
1000.0000 mg | INTRAVENOUS | Status: AC
  Administered 2018-04-08: 1000 mg via INTRAVENOUS
  Filled 2018-04-07: qty 100

## 2018-04-07 NOTE — Anesthesia Preprocedure Evaluation (Addendum)
Anesthesia Evaluation  Patient identified by MRN, date of birth, ID band Patient awake    Reviewed: Allergy & Precautions, NPO status   Airway Mallampati: II  TM Distance: >3 FB Neck ROM: Full    Dental  (+) Dental Advisory Given   Pulmonary former smoker,    breath sounds clear to auscultation       Cardiovascular hypertension,  Rhythm:Regular Rate:Normal     Neuro/Psych  Neuromuscular disease (Herniated disc with pinched nerve route.)    GI/Hepatic negative GI ROS, Neg liver ROS,   Endo/Other  negative endocrine ROS  Renal/GU negative Renal ROS     Musculoskeletal   Abdominal   Peds  Hematology negative hematology ROS (+)   Anesthesia Other Findings   Reproductive/Obstetrics                            Lab Results  Component Value Date   WBC 9.2 04/05/2018   HGB 17.4 (H) 04/05/2018   HCT 50.3 04/05/2018   MCV 88.3 04/05/2018   PLT 258.0 04/05/2018   Lab Results  Component Value Date   CREATININE 1.06 04/05/2018   BUN 14 04/05/2018   NA 136 04/05/2018   K 4.0 04/05/2018   CL 98 04/05/2018   CO2 29 04/05/2018    Anesthesia Physical Anesthesia Plan  ASA: II  Anesthesia Plan: General   Post-op Pain Management:    Induction: Intravenous  PONV Risk Score and Plan: 2 and Dexamethasone and Ondansetron  Airway Management Planned: Oral ETT  Additional Equipment:   Intra-op Plan:   Post-operative Plan: Extubation in OR  Informed Consent: I have reviewed the patients History and Physical, chart, labs and discussed the procedure including the risks, benefits and alternatives for the proposed anesthesia with the patient or authorized representative who has indicated his/her understanding and acceptance.     Dental advisory given  Plan Discussed with: CRNA  Anesthesia Plan Comments: (Per PCP clearance 04/05/18 "He has weakness and muscle atrophy in the S1 distribution and  is scheduled for nonelective discectomy on 04/08/2018.Marland KitchenMarland KitchenUltimately he is aware of the COVID-19 crisis but given the significant findings on his physical exam and lack of improvement with gabapentin, epidural steroid injections I do support that delaying surgery could result in prolonged and permanent damage.")       Anesthesia Quick Evaluation

## 2018-04-07 NOTE — Progress Notes (Addendum)
PCP - [name] Cardiologist - denies  Chest x-ray - 04-07-18 EKG - 12-04-17  DM - denies SA - denies  Anesthesia review: yes, per order  Patient denies shortness of breath, fever, cough and chest pain at PAT appointment   Patient verbalized understanding of instructions that were given to them at the PAT appointment. Patient was also instructed that they will need to review over the PAT instructions again at home before surgery.

## 2018-04-08 ENCOUNTER — Ambulatory Visit (HOSPITAL_COMMUNITY): Payer: 59 | Admitting: Anesthesiology

## 2018-04-08 ENCOUNTER — Other Ambulatory Visit: Payer: Self-pay

## 2018-04-08 ENCOUNTER — Ambulatory Visit (HOSPITAL_COMMUNITY): Payer: 59 | Admitting: Physician Assistant

## 2018-04-08 ENCOUNTER — Encounter (HOSPITAL_COMMUNITY): Payer: Self-pay | Admitting: *Deleted

## 2018-04-08 ENCOUNTER — Ambulatory Visit (HOSPITAL_COMMUNITY): Payer: 59

## 2018-04-08 ENCOUNTER — Ambulatory Visit (HOSPITAL_COMMUNITY)
Admission: RE | Admit: 2018-04-08 | Discharge: 2018-04-08 | Disposition: A | Discharge status: Home / Self Care | Payer: 59 | Attending: Orthopedic Surgery | Admitting: Orthopedic Surgery

## 2018-04-08 ENCOUNTER — Encounter (HOSPITAL_COMMUNITY)
Admission: RE | Disposition: A | Discharge status: Home / Self Care | Payer: Self-pay | Source: Home / Self Care | Attending: Orthopedic Surgery

## 2018-04-08 DIAGNOSIS — M5117 Intervertebral disc disorders with radiculopathy, lumbosacral region: Secondary | ICD-10-CM | POA: Insufficient documentation

## 2018-04-08 DIAGNOSIS — M5127 Other intervertebral disc displacement, lumbosacral region: Secondary | ICD-10-CM | POA: Diagnosis not present

## 2018-04-08 DIAGNOSIS — Z87891 Personal history of nicotine dependence: Secondary | ICD-10-CM | POA: Diagnosis not present

## 2018-04-08 DIAGNOSIS — M47817 Spondylosis without myelopathy or radiculopathy, lumbosacral region: Secondary | ICD-10-CM | POA: Diagnosis not present

## 2018-04-08 DIAGNOSIS — Z419 Encounter for procedure for purposes other than remedying health state, unspecified: Secondary | ICD-10-CM

## 2018-04-08 DIAGNOSIS — M2578 Osteophyte, vertebrae: Secondary | ICD-10-CM | POA: Insufficient documentation

## 2018-04-08 DIAGNOSIS — E669 Obesity, unspecified: Secondary | ICD-10-CM | POA: Insufficient documentation

## 2018-04-08 DIAGNOSIS — M5417 Radiculopathy, lumbosacral region: Secondary | ICD-10-CM | POA: Diagnosis not present

## 2018-04-08 DIAGNOSIS — I10 Essential (primary) hypertension: Secondary | ICD-10-CM | POA: Diagnosis not present

## 2018-04-08 DIAGNOSIS — Z6836 Body mass index (BMI) 36.0-36.9, adult: Secondary | ICD-10-CM | POA: Diagnosis not present

## 2018-04-08 HISTORY — PX: LUMBAR LAMINECTOMY/DECOMPRESSION MICRODISCECTOMY: SHX5026

## 2018-04-08 LAB — GLUCOSE, CAPILLARY: Glucose-Capillary: 129 mg/dL — ABNORMAL HIGH (ref 70–99)

## 2018-04-08 SURGERY — LUMBAR LAMINECTOMY/DECOMPRESSION MICRODISCECTOMY 1 LEVEL
Anesthesia: General | Laterality: Left

## 2018-04-08 MED ORDER — FENTANYL CITRATE (PF) 250 MCG/5ML IJ SOLN
INTRAMUSCULAR | Status: AC
  Filled 2018-04-08: qty 5

## 2018-04-08 MED ORDER — DEXAMETHASONE SODIUM PHOSPHATE 10 MG/ML IJ SOLN
INTRAMUSCULAR | Status: DC | PRN
  Administered 2018-04-08: 5 mg via INTRAVENOUS

## 2018-04-08 MED ORDER — METHYLPREDNISOLONE ACETATE 40 MG/ML IJ SUSP
INTRAMUSCULAR | Status: DC | PRN
  Administered 2018-04-08: 40 mg

## 2018-04-08 MED ORDER — ONDANSETRON HCL 4 MG/2ML IJ SOLN
INTRAMUSCULAR | Status: AC
  Filled 2018-04-08: qty 2

## 2018-04-08 MED ORDER — THROMBIN 20000 UNITS EX SOLR
CUTANEOUS | Status: DC | PRN
  Administered 2018-04-08: 20 mL via TOPICAL

## 2018-04-08 MED ORDER — OXYCODONE HCL 5 MG PO TABS
5.0000 mg | ORAL_TABLET | ORAL | Status: DC | PRN
  Administered 2018-04-08: 5 mg via ORAL

## 2018-04-08 MED ORDER — ONDANSETRON HCL 4 MG/2ML IJ SOLN: 4.0000 mg | Freq: Once | INTRAMUSCULAR | Status: DC | PRN

## 2018-04-08 MED ORDER — FENTANYL CITRATE (PF) 100 MCG/2ML IJ SOLN
INTRAMUSCULAR | Status: DC | PRN
  Administered 2018-04-08: 50 ug via INTRAVENOUS
  Administered 2018-04-08 (×3): 100 ug via INTRAVENOUS

## 2018-04-08 MED ORDER — METFORMIN HCL 500 MG PO TABS: 500.0000 mg | ORAL_TABLET | Freq: Two times a day (BID) | ORAL | 3 refills | Status: DC

## 2018-04-08 MED ORDER — ROCURONIUM BROMIDE 50 MG/5ML IV SOSY
PREFILLED_SYRINGE | INTRAVENOUS | Status: DC | PRN
  Administered 2018-04-08: 50 mg via INTRAVENOUS
  Administered 2018-04-08: 30 mg via INTRAVENOUS

## 2018-04-08 MED ORDER — SUGAMMADEX SODIUM 500 MG/5ML IV SOLN
INTRAVENOUS | Status: AC
  Filled 2018-04-08: qty 5

## 2018-04-08 MED ORDER — DEXAMETHASONE SODIUM PHOSPHATE 10 MG/ML IJ SOLN
INTRAMUSCULAR | Status: AC
  Filled 2018-04-08: qty 1

## 2018-04-08 MED ORDER — PROPOFOL 10 MG/ML IV BOLUS
INTRAVENOUS | Status: DC | PRN
  Administered 2018-04-08: 50 mg via INTRAVENOUS
  Administered 2018-04-08: 100 mg via INTRAVENOUS
  Administered 2018-04-08: 50 mg via INTRAVENOUS
  Administered 2018-04-08: 200 mg via INTRAVENOUS

## 2018-04-08 MED ORDER — METHOCARBAMOL 500 MG PO TABS: 500.0000 mg | ORAL_TABLET | Freq: Three times a day (TID) | ORAL | 0 refills | Status: AC | PRN

## 2018-04-08 MED ORDER — ONDANSETRON HCL 4 MG/2ML IJ SOLN
INTRAMUSCULAR | Status: DC | PRN
  Administered 2018-04-08: 4 mg via INTRAVENOUS

## 2018-04-08 MED ORDER — ROCURONIUM BROMIDE 50 MG/5ML IV SOSY
PREFILLED_SYRINGE | INTRAVENOUS | Status: AC
  Filled 2018-04-08: qty 10

## 2018-04-08 MED ORDER — HEMOSTATIC AGENTS (NO CHARGE) OPTIME
TOPICAL | Status: DC | PRN
  Administered 2018-04-08: 1 via TOPICAL

## 2018-04-08 MED ORDER — OXYCODONE HCL 5 MG PO TABS
ORAL_TABLET | ORAL | Status: AC
  Filled 2018-04-08: qty 1

## 2018-04-08 MED ORDER — PROPOFOL 10 MG/ML IV BOLUS
INTRAVENOUS | Status: AC
  Filled 2018-04-08: qty 40

## 2018-04-08 MED ORDER — METHOCARBAMOL 500 MG PO TABS
ORAL_TABLET | ORAL | Status: AC
  Filled 2018-04-08: qty 1

## 2018-04-08 MED ORDER — 0.9 % SODIUM CHLORIDE (POUR BTL) OPTIME
TOPICAL | Status: DC | PRN
  Administered 2018-04-08: 1000 mL

## 2018-04-08 MED ORDER — ACETAMINOPHEN 500 MG PO TABS
ORAL_TABLET | ORAL | Status: AC
  Administered 2018-04-08: 1000 mg
  Filled 2018-04-08: qty 2

## 2018-04-08 MED ORDER — SUCCINYLCHOLINE CHLORIDE 20 MG/ML IJ SOLN
INTRAMUSCULAR | Status: DC | PRN
  Administered 2018-04-08: 160 mg via INTRAVENOUS

## 2018-04-08 MED ORDER — LIDOCAINE 2% (20 MG/ML) 5 ML SYRINGE
INTRAMUSCULAR | Status: AC
  Filled 2018-04-08: qty 5

## 2018-04-08 MED ORDER — FENTANYL CITRATE (PF) 100 MCG/2ML IJ SOLN: 25.0000 ug | INTRAMUSCULAR | Status: DC | PRN

## 2018-04-08 MED ORDER — LACTATED RINGERS IV SOLN
INTRAVENOUS | Status: DC | PRN
  Administered 2018-04-08 (×2): via INTRAVENOUS

## 2018-04-08 MED ORDER — MIDAZOLAM HCL 5 MG/5ML IJ SOLN
INTRAMUSCULAR | Status: DC | PRN
  Administered 2018-04-08: 2 mg via INTRAVENOUS

## 2018-04-08 MED ORDER — LIDOCAINE 2% (20 MG/ML) 5 ML SYRINGE
INTRAMUSCULAR | Status: DC | PRN
  Administered 2018-04-08: 100 mg via INTRAVENOUS

## 2018-04-08 MED ORDER — BUPIVACAINE-EPINEPHRINE (PF) 0.25% -1:200000 IJ SOLN
INTRAMUSCULAR | Status: AC
  Filled 2018-04-08: qty 30

## 2018-04-08 MED ORDER — MIDAZOLAM HCL 2 MG/2ML IJ SOLN
INTRAMUSCULAR | Status: AC
  Filled 2018-04-08: qty 2

## 2018-04-08 MED ORDER — THROMBIN 20000 UNITS EX KIT
PACK | CUTANEOUS | Status: AC
  Filled 2018-04-08: qty 1

## 2018-04-08 MED ORDER — ONDANSETRON HCL 4 MG PO TABS: 4.0000 mg | ORAL_TABLET | Freq: Three times a day (TID) | ORAL | 0 refills | Status: DC | PRN

## 2018-04-08 MED ORDER — METHYLPREDNISOLONE ACETATE 40 MG/ML IJ SUSP
INTRAMUSCULAR | Status: AC
  Filled 2018-04-08: qty 1

## 2018-04-08 MED ORDER — METHOCARBAMOL 500 MG PO TABS
500.0000 mg | ORAL_TABLET | Freq: Four times a day (QID) | ORAL | Status: DC | PRN
  Administered 2018-04-08: 500 mg via ORAL

## 2018-04-08 MED ORDER — BUPIVACAINE-EPINEPHRINE 0.25% -1:200000 IJ SOLN
INTRAMUSCULAR | Status: DC | PRN
  Administered 2018-04-08: 10 mL

## 2018-04-08 MED ORDER — OXYCODONE-ACETAMINOPHEN 10-325 MG PO TABS: 1.0000 | ORAL_TABLET | Freq: Four times a day (QID) | ORAL | 0 refills | Status: AC | PRN

## 2018-04-08 MED ORDER — METHOCARBAMOL 1000 MG/10ML IJ SOLN: 500.0000 mg | Freq: Four times a day (QID) | INTRAVENOUS | Status: DC | PRN

## 2018-04-08 MED ORDER — DEXAMETHASONE SODIUM PHOSPHATE 4 MG/ML IJ SOLN: INTRAMUSCULAR | Status: DC | PRN

## 2018-04-08 MED ORDER — SUCCINYLCHOLINE CHLORIDE 200 MG/10ML IV SOSY
PREFILLED_SYRINGE | INTRAVENOUS | Status: AC
  Filled 2018-04-08: qty 10

## 2018-04-08 MED ORDER — THROMBIN (RECOMBINANT) 20000 UNITS EX SOLR
CUTANEOUS | Status: AC
  Filled 2018-04-08: qty 20000

## 2018-04-08 MED FILL — METHOCARBAMOL 500 MG TABLET: 500 | 5 days supply | Qty: 15 | Fill #0

## 2018-04-08 MED FILL — OXYCODONE-APAP 10-325: 10-325 | 5 days supply | Qty: 20 | Fill #0

## 2018-04-08 MED FILL — ONDANSETRON HCL 4 MG TABLET: 4 | 6 days supply | Qty: 20 | Fill #0

## 2018-04-08 SURGICAL SUPPLY — 70 items
BNDG GAUZE ELAST 4 BULKY (GAUZE/BANDAGES/DRESSINGS) ×3 IMPLANT
BRUSH SCRUB EZ  4% CHG (MISCELLANEOUS) ×2
BRUSH SCRUB EZ 4% CHG (MISCELLANEOUS) ×1 IMPLANT
BUR EGG ELITE 4.0 (BURR) IMPLANT
BUR EGG ELITE 4.0MM (BURR)
BUR MATCHSTICK NEURO 3.0 LAGG (BURR) IMPLANT
CANISTER SUCT 3000ML PPV (MISCELLANEOUS) ×3 IMPLANT
CLOSURE STERI-STRIP 1/2X4 (GAUZE/BANDAGES/DRESSINGS) ×1
CLSR STERI-STRIP ANTIMIC 1/2X4 (GAUZE/BANDAGES/DRESSINGS) ×2 IMPLANT
CONT SPECI 4OZ STER CLIK (MISCELLANEOUS) ×3 IMPLANT
CORD BI POLAR (MISCELLANEOUS) ×3 IMPLANT
COVER SURGICAL LIGHT HANDLE (MISCELLANEOUS) ×3 IMPLANT
COVER WAND RF STERILE (DRAPES) ×3 IMPLANT
DRAIN CHANNEL 15F RND FF W/TCR (WOUND CARE) IMPLANT
DRAPE POUCH INSTRU U-SHP 10X18 (DRAPES) ×3 IMPLANT
DRAPE SURG 17X23 STRL (DRAPES) ×3 IMPLANT
DRAPE U-SHAPE 47X51 STRL (DRAPES) ×3 IMPLANT
DRSG OPSITE POSTOP 3X4 (GAUZE/BANDAGES/DRESSINGS) ×3 IMPLANT
DURAPREP 26ML APPLICATOR (WOUND CARE) ×3 IMPLANT
ELECT BLADE 4.0 EZ CLEAN MEGAD (MISCELLANEOUS)
ELECT CAUTERY BLADE 6.4 (BLADE) ×3 IMPLANT
ELECT PENCIL ROCKER SW 15FT (MISCELLANEOUS) ×3 IMPLANT
ELECT REM PT RETURN 9FT ADLT (ELECTROSURGICAL) ×3
ELECTRODE BLDE 4.0 EZ CLN MEGD (MISCELLANEOUS) IMPLANT
ELECTRODE REM PT RTRN 9FT ADLT (ELECTROSURGICAL) ×1 IMPLANT
EVACUATOR SILICONE 100CC (DRAIN) IMPLANT
FLOSEAL 10ML (HEMOSTASIS) IMPLANT
GLOVE BIO SURGEON STRL SZ 6.5 (GLOVE) ×4 IMPLANT
GLOVE BIO SURGEON STRL SZ8 (GLOVE) ×3 IMPLANT
GLOVE BIO SURGEONS STRL SZ 6.5 (GLOVE) ×2
GLOVE BIOGEL M SZ8.5 STRL (GLOVE) ×3 IMPLANT
GLOVE BIOGEL PI IND STRL 6.5 (GLOVE) ×2 IMPLANT
GLOVE BIOGEL PI IND STRL 8.5 (GLOVE) ×1 IMPLANT
GLOVE BIOGEL PI INDICATOR 6.5 (GLOVE) ×4
GLOVE BIOGEL PI INDICATOR 8.5 (GLOVE) ×2
GLOVE SS BIOGEL STRL SZ 8.5 (GLOVE) ×1 IMPLANT
GLOVE SUPERSENSE BIOGEL SZ 8.5 (GLOVE) ×2
GOWN STRL REUS W/ TWL LRG LVL3 (GOWN DISPOSABLE) ×2 IMPLANT
GOWN STRL REUS W/ TWL XL LVL3 (GOWN DISPOSABLE) ×2 IMPLANT
GOWN STRL REUS W/TWL 2XL LVL3 (GOWN DISPOSABLE) ×3 IMPLANT
GOWN STRL REUS W/TWL LRG LVL3 (GOWN DISPOSABLE) ×4
GOWN STRL REUS W/TWL XL LVL3 (GOWN DISPOSABLE) ×4
KIT BASIN OR (CUSTOM PROCEDURE TRAY) ×3 IMPLANT
KIT TURNOVER KIT B (KITS) ×3 IMPLANT
NEEDLE 22X1 1/2 (OR ONLY) (NEEDLE) ×3 IMPLANT
NEEDLE SPNL 18GX3.5 QUINCKE PK (NEEDLE) ×6 IMPLANT
NS IRRIG 1000ML POUR BTL (IV SOLUTION) ×3 IMPLANT
PACK LAMINECTOMY ORTHO (CUSTOM PROCEDURE TRAY) ×3 IMPLANT
PACK UNIVERSAL I (CUSTOM PROCEDURE TRAY) ×3 IMPLANT
PAD ARMBOARD 7.5X6 YLW CONV (MISCELLANEOUS) ×6 IMPLANT
PATTIES SURGICAL .5 X.5 (GAUZE/BANDAGES/DRESSINGS) ×3 IMPLANT
PATTIES SURGICAL .5 X1 (DISPOSABLE) ×3 IMPLANT
SPONGE SURGIFOAM ABS GEL 100 (HEMOSTASIS) IMPLANT
SURGIFLO W/THROMBIN 8M KIT (HEMOSTASIS) ×3 IMPLANT
SUT BONE WAX W31G (SUTURE) ×3 IMPLANT
SUT MON AB 3-0 SH 27 (SUTURE) ×2
SUT MON AB 3-0 SH27 (SUTURE) ×1 IMPLANT
SUT VIC AB 0 CT1 27 (SUTURE) ×2
SUT VIC AB 0 CT1 27XBRD ANBCTR (SUTURE) ×1 IMPLANT
SUT VIC AB 1 CT1 18XCR BRD 8 (SUTURE) ×1 IMPLANT
SUT VIC AB 1 CT1 8-18 (SUTURE) ×2
SUT VIC AB 2-0 CT1 18 (SUTURE) ×3 IMPLANT
SYR BULB IRRIGATION 50ML (SYRINGE) ×3 IMPLANT
SYR CONTROL 10ML LL (SYRINGE) ×3 IMPLANT
SYRINGE 1CC SLIP TB (MISCELLANEOUS) ×6 IMPLANT
TOWEL GREEN STERILE (TOWEL DISPOSABLE) ×3 IMPLANT
TOWEL GREEN STERILE FF (TOWEL DISPOSABLE) ×3 IMPLANT
WATER STERILE IRR 1000ML POUR (IV SOLUTION) ×3 IMPLANT
YANKAUER SUCT BULB TIP NO VENT (SUCTIONS) IMPLANT
dexamethasone 4 mg/ml IMPLANT

## 2018-04-08 NOTE — H&P (Signed)
Addendum H&P dictated by Dr. Shon Baton  No change in patient's clinical exam from last office visit.  Patient continues to have severe debilitating radicular left leg pain with motor and sensory deficits.  Plan on moving forward with L5-S1 microdiscectomy.  Risks and benefits as well as the surgical procedure itself was reviewed again with the patient and all of his questions were encouraged and addressed.

## 2018-04-08 NOTE — Brief Op Note (Signed)
04/08/2018  9:41 AM  PATIENT:  Kyle [name]  38 y.o. male  PRE-OPERATIVE DIAGNOSIS:  Hernated disc Left L5-S1  POST-OPERATIVE DIAGNOSIS:  Hernated disc Left L5-S1  PROCEDURE:  Procedure(s) with comments: Left L5-S1 discectomy (Left) - 120 mins  SURGEON:  Surgeon(s) and Role:    Venita Lick, MD - Primary  PHYSICIAN ASSISTANT:   ASSISTANTS: Amanda Ward, PA   ANESTHESIA:   general  EBL:  minimal   BLOOD ADMINISTERED:none  DRAINS: none   LOCAL MEDICATIONS USED:  MARCAINE    and OTHER depomedrol  SPECIMEN:  No Specimen  DISPOSITION OF SPECIMEN:  N/A  COUNTS:  YES  TOURNIQUET:  * No tourniquets in log *  DICTATION: .Dragon Dictation  PLAN OF CARE: Discharge to home after PACU  PATIENT DISPOSITION:  PACU - hemodynamically stable.

## 2018-04-08 NOTE — Progress Notes (Signed)
Ambulate x 1 around perimeter of pacu

## 2018-04-08 NOTE — Progress Notes (Signed)
My chart message sent. Start Metformin. Under age of 31.  With 40 year calc ASCVD is less than 1%.

## 2018-04-08 NOTE — Addendum Note (Signed)
Addended by: [name] D on: 04/08/2018 08:21 AM   Modules accepted: Orders

## 2018-04-08 NOTE — Op Note (Signed)
Operative report  Preoperative diagnosis: L5-S1 left-sided lumbar disc herniation with S1 radiculopathy  Postoperative diagnosis: Same  Operative procedure: L5-S1 left-sided hemi-laminotomy for discectomy (CPT code 62130)  First Assistant: Glynis Smiles, PA  Indications: This is a very pleasant 38 year old gentleman who presents with significant radicular left leg pain consistent with new motor and sensory deficits.  As result of the findings of an L5-S1 acute disc herniation with S1 nerve compression and the neurological deficits we elected to move forward with surgery.  All appropriate risks benefits and alternatives were discussed with the patient and consent was obtained.  Operative report: Patient is brought the operating room placed upon the operating room table.  After successful induction of general anesthesia and endotracheally patient teds SCDs were applied he was turned prone onto the Wilson frame.  All bony prominences well-padded and the back was prepped and draped in a standard fashion.  Timeout was taken to confirm patient procedure and all other important data.  2 needles were then placed into the back and an x-ray was taken for localization of the incision.  The incision site was marked and then infiltrated with quarter percent Marcaine with epinephrine.  Midline incision was made centered over the L5-S1 disc space and sharp dissection was carried out down to the deep fascia.  Deep fascia was sharply incised and I stripped the paraspinal muscles using a Cobb elevator to expose the L5 and S1 lamina, as well as the facet complex.  A Penfield 4 was placed underneath the L5 lamina and an x-ray was taken to confirm that I was at the appropriate level.  Once this was confirmed a Cytogeneticist was placed on the lateral side of the facet complex and left-sided hemilaminotomy was performed using a Kerrison rongeurs.  The ligamentum flavum was dissected using a Penfield 4 and then resected with  Kerrison rongeurs.  I could now visualize the dorsal surface of the thecal sac as well as the S1 nerve root.  There was significant dorsal displacement and compression of the S1 nerve root.  It was very difficult to mobilize the nerve because of the compression of the disc herniation.  I performed a medial facetectomy in order to visualize the disc space and have room to began mobilizing the nerve and thecal sac.  I was unable to place into nerve root retractor and then perform an annulotomy with a 15 blade scalpel.  Using a nerve hook I delivered 3 large fragments of disc material through the annulotomy and then removed it with pituitary rongeurs.  Once this was done the nerve root itself was now no longer under compression and was freely mobile.  I continued using my Epstein curette and nerve hook to resect the osteophyte as well as small free fragments of disc material.  This was all resected using a micropituitary rongeurs.  Once I had an adequate discectomy I could now freely pass my Woodson elevator superiorly in the lateral recess along the lateral recess and into the S1 foramen.  The S1 nerve root was now freely mobile and no longer under compression  I can also sweep the [facility] elevator circumferentially at the level of the annulus and there was no further central compression of the thecal sac.  At this point I was very pleased with the discectomy.  I obtained hemostasis using bipolar cautery as well as FloSeal.  At this point the wound was copiously irrigated with normal saline and then I placed possibly 1 cc (40 mg) of  Depo-Medrol to provide postoperative analgesia.  I then placed a thrombin-soaked Gelfoam patty over the laminotomy site and remove the retracting system.  I then closed the deep fascia with #1 Vicryl suture and then I closed in a layered fashion with a running 0 Vicryl, 2-0 Vicryl, and 3-0 Monocryl for the skin.  Steri-Strips and a dry dressing were applied and the patient was ultimately  extubated transfer the PACU without incident.  The end of the case all needle sponge counts were correct.  There were no intraoperative complications.

## 2018-04-08 NOTE — Anesthesia Procedure Notes (Signed)
Procedure Name: Intubation Date/Time: 04/08/2018 7:47 AM Performed by: Fransisca Kaufmann, CRNA Pre-anesthesia Checklist: Patient identified, Emergency Drugs available, Suction available and Patient being monitored Patient Re-evaluated:Patient Re-evaluated prior to induction Oxygen Delivery Method: Circle System Utilized Preoxygenation: Pre-oxygenation with 100% oxygen Induction Type: IV induction Ventilation: Mask ventilation without difficulty Laryngoscope Size: Miller and 3 Grade View: Grade II Tube type: Oral Tube size: 7.5 mm Number of attempts: 1 Airway Equipment and Method: Stylet and Oral airway Placement Confirmation: ETT inserted through vocal cords under direct vision,  positive ETCO2 and breath sounds checked- equal and bilateral Secured at: 23 cm Tube secured with: Tape Dental Injury: Teeth and Oropharynx as per pre-operative assessment

## 2018-04-08 NOTE — Transfer of Care (Signed)
Immediate Anesthesia Transfer of Care Note  Patient: Kyle [name]  Procedure(s) Performed: Left L5-S1 discectomy (Left )  Patient Location: PACU  Anesthesia Type:General  Level of Consciousness: awake, alert , oriented and sedated  Airway & Oxygen Therapy: Patient Spontanous Breathing and Patient connected to face mask oxygen  Post-op Assessment: Report given to RN, Post -op Vital signs reviewed and stable and Patient moving all extremities  Post vital signs: Reviewed and stable  Last Vitals:  Vitals Value Taken Time  BP 124/93 04/08/2018 10:12 AM  Temp    Pulse 93 04/08/2018 10:13 AM  Resp 18 04/08/2018 10:13 AM  SpO2 100 % 04/08/2018 10:13 AM  Vitals shown include unvalidated device data.  Last Pain:  Vitals:   04/08/18 0554  TempSrc:   PainSc: 5          Complications: No apparent anesthesia complications

## 2018-04-08 NOTE — Discharge Instructions (Signed)
  Surgical Spinal Decompression, Care After Refer to this sheet in the next few weeks. These instructions provide you with information about caring for yourself after your procedure. Your health care provider may also give you more specific instructions. Your treatment has been planned according to current medical practices, but problems sometimes occur. Call your health care provider if you have any problems or questions after your procedure. What can I expect after the procedure? It is common to have pain for the first few days after the procedure. Some people continue to have mild pain even after making a full recovery. Follow these instructions at home: Medicine Take medicines only as directed by your health care provider. Avoid taking over-the-counter pain medicines unless your health care provider tells you otherwise. These medicines interfere with the development and growth of new bone cells. If you were prescribed a narcotic pain medicine, take it exactly as told by your health care provider. Do not drink alcohol while on the medicine. Do not drive while on the medicine. Injury care Care for your back brace as told by your health care provider. Brace should be worn at all times for the first 6 weeks except when sleeping, bathing, and sitting leisurely around the home. You CANNOT drive while wearing the brace, however, you may wear it as a passenger.  If directed, apply ice to the injured area: Put ice in a plastic bag. Place a towel between your skin and the bag. Leave the ice on for 20 minutes, 2-3 times a day. Activity Perform physical therapy exercises as told by your health care provider. Exercise regularly. Start by taking short walks. Slowly increase your activity level over time. Gentle exercise helps to ease pain. Sit, stand, walk, turn in bed, and reposition yourself as told by your health care provider. This will help to keep your spine in proper alignment. Avoid bending and  twisting your body. Avoid doing strenuous household chores, such as vacuuming. Do not lift anything that is heavier than 10 lb (4.5 kg). Other Instructions Keep all follow-up visits as directed by your health care provider. This is important. Do not use any tobacco products, including cigarettes, chewing tobacco, or electronic cigarettes. If you need help quitting, ask your health care provider. Nicotine affects the way bones heal. Contact a health care provider if: Your pain gets worse. You have a fever. You have redness, swelling, or pain at the site of your incision. You have fluid, blood, or pus coming from your incision. You have numbness, tingling, or weakness in any part of your body. Get help right away if: Your incision feels swollen and tender, and the surrounding area looks like a lump. The lump may be red or bluish in color. You cannot move any part of your body (paralysis). You cannot control your bladder or bowels. This information is not intended to replace advice given to you by your health care provider. Make sure you discuss any questions you have with your health care provider. 

## 2018-04-09 ENCOUNTER — Encounter (HOSPITAL_COMMUNITY): Payer: Self-pay | Admitting: Orthopedic Surgery

## 2018-04-09 MED FILL — Thrombin (Recombinant) For Soln 20000 Unit: CUTANEOUS | Qty: 1 | Status: AC

## 2018-04-09 NOTE — Anesthesia Postprocedure Evaluation (Signed)
Anesthesia Post Note  Patient: Kyle [name]  Procedure(s) Performed: Left L5-S1 discectomy (Left )     Patient location during evaluation: PACU Anesthesia Type: General Level of consciousness: awake and alert Pain management: pain level controlled Vital Signs Assessment: post-procedure vital signs reviewed and stable Respiratory status: spontaneous breathing, nonlabored ventilation, respiratory function stable and patient connected to nasal cannula oxygen Cardiovascular status: blood pressure returned to baseline and stable Postop Assessment: no apparent nausea or vomiting Anesthetic complications: no    Last Vitals:  Vitals:   04/08/18 1056 04/08/18 1140  BP: 138/86 129/84  Pulse:  87  Resp: 17 20  Temp: (!) 25.6 C   SpO2: 99% 96%    Last Pain:  Vitals:   04/08/18 1140  TempSrc:   PainSc: 3                  Kennieth Rad

## 2018-04-10 MED FILL — LISINOPRIL-HCTZ 20-12.5 MG: 20-12.5 | 90 days supply | Qty: 90 | Fill #0

## 2018-04-10 MED FILL — metFORMIN HCL 500 MG TABS: 500 | 90 days supply | Qty: 180 | Fill #0

## 2018-04-12 ENCOUNTER — Encounter (HOSPITAL_COMMUNITY): Payer: Self-pay | Admitting: Orthopedic Surgery

## 2018-07-31 MED FILL — LISINOPRIL-HCTZ 20-12.5 MG: 20-12.5 | 90 days supply | Qty: 90 | Fill #1

## 2018-07-31 MED FILL — metFORMIN HCL 500 MG TABS: 500 | 90 days supply | Qty: 180 | Fill #1

## 2018-08-20 ENCOUNTER — Encounter (HOSPITAL_COMMUNITY): Payer: Self-pay

## 2018-08-20 ENCOUNTER — Ambulatory Visit (HOSPITAL_COMMUNITY)
Admission: EM | Admit: 2018-08-20 | Discharge: 2018-08-20 | Disposition: A | Discharge status: Home / Self Care | Payer: 59 | Attending: Family Medicine | Admitting: Family Medicine

## 2018-08-20 ENCOUNTER — Other Ambulatory Visit: Payer: Self-pay

## 2018-08-20 DIAGNOSIS — N410 Acute prostatitis: Secondary | ICD-10-CM | POA: Diagnosis not present

## 2018-08-20 HISTORY — DX: Type 2 diabetes mellitus without complications: E11.9

## 2018-08-20 LAB — POCT URINALYSIS DIP (DEVICE)
Bilirubin Urine: NEGATIVE
Glucose, UA: NEGATIVE mg/dL
Hgb urine dipstick: NEGATIVE
Ketones, ur: NEGATIVE mg/dL
Leukocytes,Ua: NEGATIVE
Nitrite: NEGATIVE
Protein, ur: NEGATIVE mg/dL
Specific Gravity, Urine: 1.025 (ref 1.005–1.030)
Urobilinogen, UA: 0.2 mg/dL (ref 0.0–1.0)
pH: 6 (ref 5.0–8.0)

## 2018-08-20 MED ORDER — SULFAMETHOXAZOLE-TRIMETHOPRIM 800-160 MG PO TABS: 1.0000 | ORAL_TABLET | Freq: Two times a day (BID) | ORAL | 0 refills | Status: AC

## 2018-08-20 MED FILL — SULFAMETHOXAZOLE-TMP DS TAB: 800-160 | 10 days supply | Qty: 20 | Fill #0

## 2018-08-20 NOTE — ED Triage Notes (Signed)
Pt presents with urinary tract symptoms; Pt states he has lower pelvic pain and pain with urination since yesterday.

## 2018-08-20 NOTE — ED Provider Notes (Signed)
[facility]    CSN: [id number] Arrival date & time: 08/20/18  0909      History   Chief Complaint Chief Complaint  Patient presents with  . Urinary Tract Infection    HPI Kyle [name] is a 38 y.o. male.   HPI  Patient states that he has been having some pressure and pain in his lower abdomen since yesterday.  He states he feels like his bladder is overfilled.  Feels like he is having increasing difficulty emptying his bladder.  He states that he has some pain with urination.  He also has pain with bowel movement that he describes as "pressure".  No low back pain.  No nausea vomiting.  Denies fever but his temperature is 99.3.  He has not had any problems with urinary infections, prostate, kidneys.  No hematuria. Happily married no concern for STD.  Past Medical History:  Diagnosis Date  . Diabetes mellitus without complication (HCC)   . Hypertension   . Lumbar herniated disc   . Pilonidal disease   . Sciatica 2017   Left  . Wears contact lenses   . Wears glasses     Patient Active Problem List   Diagnosis Date Noted  . Lumbar herniated disc 04/05/2018  . 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 Class 2 obesity due to excess calories without serious comorbidity with body mass index (BMI) of 39.0 to 39.9 in adult 03/04/2017  . HTN, goal below 140/90 11/19/2015    Class: Chronic  . Pilonidal disease s/p excision 12/04/2017 11/19/2015  . Blood glucose elevated 11/19/2015    Past Surgical History:  Procedure Laterality Date  . ABCESS DRAINAGE     Pilonidal cyst  . LUMBAR LAMINECTOMY/DECOMPRESSION MICRODISCECTOMY Left 04/08/2018   Procedure: Left L5-S1 discectomy;  Surgeon: Venita LickBrooks, Dahari, MD;  [facility];  Service: Orthopedics;  Laterality: Left;  120 mins  . PILONIDAL CYST EXCISION N/A 12/04/2017   Procedure: EXCISION OF PILONIDAL DISEASE;  Surgeon: Karie SodaGross, Steven, MD;  [facility];  Service: General;  Laterality: N/A;  . WISDOM TOOTH EXTRACTION         Home Medications    Prior to Admission medications   Medication Sig Start Date End Date Taking? Authorizing Provider  lisinopril-hydrochlorothiazide (PRINZIDE,ZESTORETIC) 20-12.5 MG tablet Take 1 tablet by mouth daily. 04/05/18   Andrena Mewsigby, Michael D, DO  metFORMIN (GLUCOPHAGE) 500 MG tablet Take 1 tablet (500 mg total) by mouth 2 (two) times daily with a meal. Start when discharged from Hospital. 04/08/18   Andrena Mewsigby, Michael D, DO  ondansetron (ZOFRAN) 4 MG tablet Take 1 tablet (4 mg total) by mouth every 8 (eight) hours as needed for nausea or vomiting. 04/08/18   Venita LickBrooks, Dahari, MD  sulfamethoxazole-trimethoprim (BACTRIM DS) 800-160 MG tablet Take 1 tablet by mouth 2 (two) times daily for 10 days. 08/20/18 08/30/18  Eustace MooreNelson, Lachrista Heslin Sue, MD    Family History Family History  Problem Relation Age of Onset  . Diabetes Father   . Hypertension Father   . Inflammatory bowel disease Sister     Social History Social History   Tobacco Use  . Smoking status: Former Smoker    Types: Cigars    Quit date: 10/13/2017    Years since quitting: 0.8  . Smokeless tobacco: Never Used  . Tobacco comment: once monthly  Substance Use Topics  . Alcohol use: Yes    Alcohol/week: 1.0 standard drinks  Types: 1 Cans of beer per week  . Drug use: No     Allergies   Patient has no known allergies.   Review of Systems Review of Systems  Constitutional: Negative for chills and fever.  HENT: Negative for ear pain and sore throat.   Eyes: Negative for pain and visual disturbance.  Respiratory: Negative for cough and shortness of breath.   Cardiovascular: Negative for chest pain and palpitations.  Gastrointestinal: Positive for abdominal pain. Negative for vomiting.  Genitourinary: Positive for difficulty urinating and dysuria. Negative for hematuria.  Musculoskeletal: Negative for arthralgias and back pain.  Skin: Negative  for color change and rash.  Neurological: Negative for seizures and syncope.  All other systems reviewed and are negative.    Physical Exam Triage Vital Signs ED Triage Vitals  Enc Vitals Group     BP 08/20/18 0926 (!) 149/94     Pulse Rate 08/20/18 0926 97     Resp 08/20/18 0926 19     Temp 08/20/18 0926 99.3 F (37.4 C)     Temp Source 08/20/18 0926 Oral     SpO2 08/20/18 0926 99 %     Weight --      Height --      Head Circumference --      Peak Flow --      Pain Score 08/20/18 0928 7     Pain Loc --      Pain Edu? --      Excl. in [place]? --    No data found.  Updated Vital Signs BP (!) 149/94 (BP Location: Left Arm)   Pulse 97   Temp 99.3 F (37.4 C) (Oral)   Resp 19   SpO2 99%      Physical Exam Constitutional:      General: He is not in acute distress.    Appearance: He is well-developed. He is obese.  HENT:     Head: Normocephalic and atraumatic.  Eyes:     Conjunctiva/sclera: Conjunctivae normal.     Pupils: Pupils are equal, round, and reactive to light.  Neck:     Musculoskeletal: Normal range of motion.  Cardiovascular:     Rate and Rhythm: Normal rate.  Pulmonary:     Effort: Pulmonary effort is normal. No respiratory distress.  Abdominal:     General: There is no distension.     Palpations: Abdomen is soft.     Comments: Patient complains of increased pain with suprapubic pressure  Genitourinary:    Comments: Patient refuses rectal Musculoskeletal: Normal range of motion.     Comments: No back tenderness, no limited movement  Skin:    General: Skin is warm and dry.  Neurological:     Mental Status: He is alert.      UC Treatments / Results  Labs (all labs ordered are listed, but only abnormal results are displayed) Labs Reviewed  POCT URINALYSIS DIP (DEVICE)    EKG   Radiology No results found.  Procedures Procedures (including critical care time)  Medications Ordered in UC Medications - No data to display  Initial  Impression / Assessment and Plan / UC Course  I have reviewed the triage vital signs and the nursing notes.  Pertinent labs & imaging results that were available during my care of the patient were reviewed by me and considered in my medical decision making (see chart for details).     Symptoms most consistent with prostatitis.  Will treat with antibiotics.  Push  fluids.  Call or return if symptoms do not improve within the next 2 to 3 days Final Clinical Impressions(s) / UC Diagnoses   Final diagnoses:  Acute prostatitis     Discharge Instructions     Home to rest Take the septra 2 x a day for 10 days Take 2 doses today Push fluids Call for problems   ED Prescriptions    Medication Sig Dispense Auth. Provider   sulfamethoxazole-trimethoprim (BACTRIM DS) 800-160 MG tablet Take 1 tablet by mouth 2 (two) times daily for 10 days. 20 tablet Eustace MooreNelson, Hayes Czaja Sue, MD     Controlled Substance Prescriptions [place] Controlled Substance Registry consulted? Not Applicable   Eustace MooreNelson, Lorean Ekstrand Sue, MD 08/20/18 [phone]

## 2018-08-20 NOTE — Discharge Instructions (Addendum)
Home to rest Take the septra 2 x a day for 10 days Take 2 doses today Push fluids Call for problems

## 2018-11-22 MED FILL — LISINOPRIL-HCTZ 20-12.5 MG: 20-12.5 | 90 days supply | Qty: 90 | Fill #2

## 2018-12-17 MED FILL — metFORMIN HCL 500 MG TABS: 500 | 90 days supply | Qty: 180 | Fill #0

## 2018-12-30 MED FILL — metFORMIN HCL 500 MG TABS: 500 | 90 days supply | Qty: 180 | Fill #0

## 2019-02-10 DIAGNOSIS — Z20828 Contact with and (suspected) exposure to other viral communicable diseases: Secondary | ICD-10-CM | POA: Diagnosis not present

## 2019-02-14 DIAGNOSIS — E119 Type 2 diabetes mellitus without complications: Secondary | ICD-10-CM | POA: Diagnosis not present

## 2019-02-14 DIAGNOSIS — H43812 Vitreous degeneration, left eye: Secondary | ICD-10-CM | POA: Diagnosis not present

## 2019-02-14 DIAGNOSIS — H52222 Regular astigmatism, left eye: Secondary | ICD-10-CM | POA: Diagnosis not present

## 2019-02-14 DIAGNOSIS — H43392 Other vitreous opacities, left eye: Secondary | ICD-10-CM | POA: Diagnosis not present

## 2019-02-14 DIAGNOSIS — Z7984 Long term (current) use of oral hypoglycemic drugs: Secondary | ICD-10-CM | POA: Diagnosis not present

## 2019-02-14 DIAGNOSIS — E113212 Type 2 diabetes mellitus with mild nonproliferative diabetic retinopathy with macular edema, left eye: Secondary | ICD-10-CM | POA: Diagnosis not present

## 2019-02-14 DIAGNOSIS — H5213 Myopia, bilateral: Secondary | ICD-10-CM | POA: Diagnosis not present

## 2019-02-14 DIAGNOSIS — H3581 Retinal edema: Secondary | ICD-10-CM | POA: Diagnosis not present

## 2019-03-14 MED FILL — LISINOPRIL-HCTZ 20-12.5 MG: 20-12.5 | 90 days supply | Qty: 90 | Fill #0

## 2019-06-17 ENCOUNTER — Other Ambulatory Visit (HOSPITAL_COMMUNITY): Payer: Self-pay | Admitting: Sports Medicine

## 2019-06-17 DIAGNOSIS — I10 Essential (primary) hypertension: Secondary | ICD-10-CM | POA: Diagnosis not present

## 2019-06-17 DIAGNOSIS — E6609 Other obesity due to excess calories: Secondary | ICD-10-CM | POA: Diagnosis not present

## 2019-06-17 DIAGNOSIS — R7303 Prediabetes: Secondary | ICD-10-CM | POA: Diagnosis not present

## 2019-06-17 MED FILL — METFORMIN HCL 500 MG TABS: 500 | 90 days supply | Qty: 180 | Fill #0

## 2019-06-17 MED FILL — LISINOPRIL-HCTZ 20-12.5 MG: 20-12.5 | 90 days supply | Qty: 90 | Fill #0

## 2019-06-28 DIAGNOSIS — R7303 Prediabetes: Secondary | ICD-10-CM | POA: Diagnosis not present

## 2019-06-28 DIAGNOSIS — I10 Essential (primary) hypertension: Secondary | ICD-10-CM | POA: Diagnosis not present

## 2019-09-21 MED FILL — LISINOPRIL-HCTZ 20-12.5 MG: 20-12.5 | 90 days supply | Qty: 90 | Fill #0

## 2019-10-17 MED FILL — METFORMIN HCL 500 MG TABS: 500 | 90 days supply | Qty: 180 | Fill #0

## 2019-11-21 IMAGING — CR CHEST - 2 VIEW
2 series · 2 of 2 positions shown · non-contrast
Comparison: None.

CLINICAL DATA: Preoperative assessment for lumbar surgery.
Hypertension.

EXAM:
CHEST - 2 VIEW

[w chest pa]
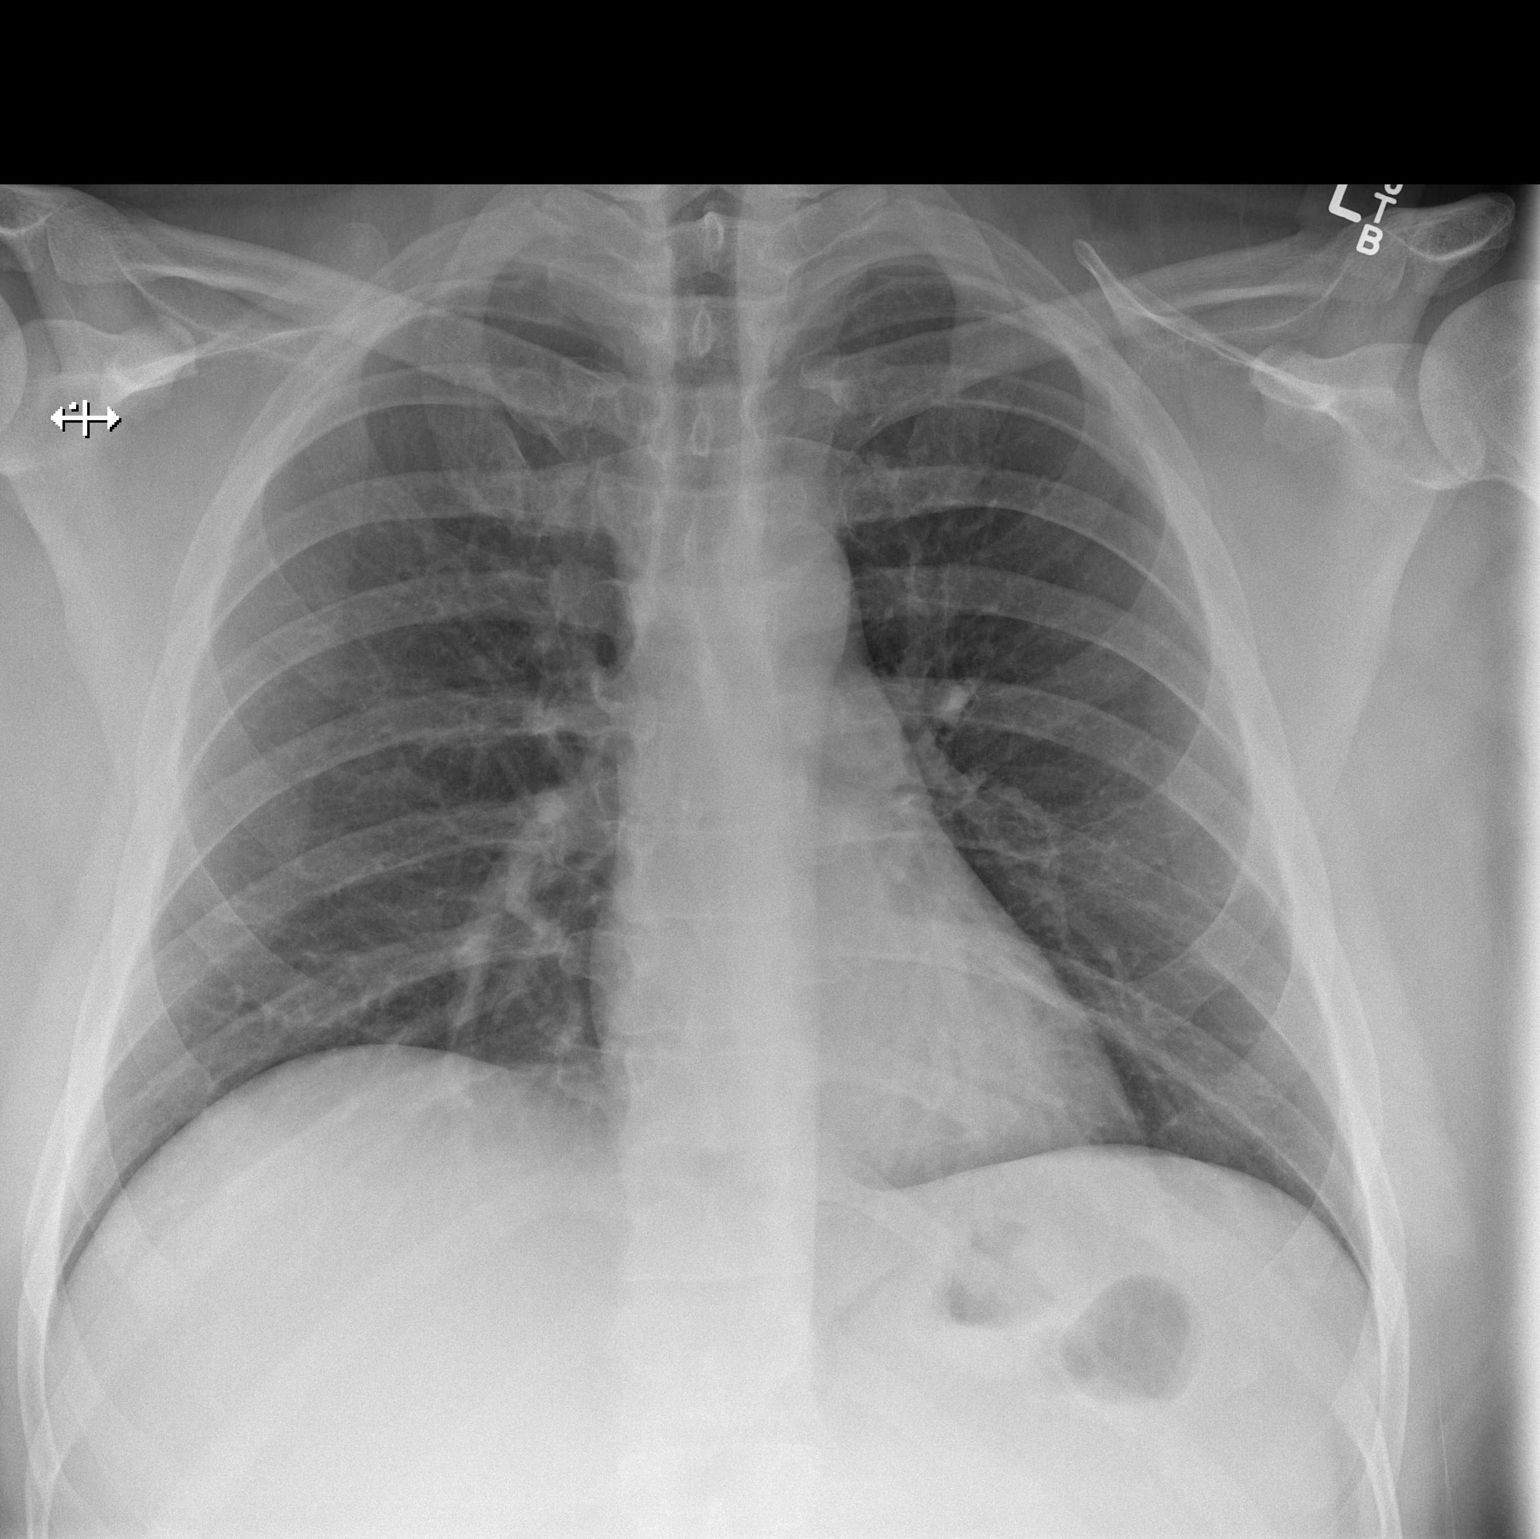

[w chest lat]
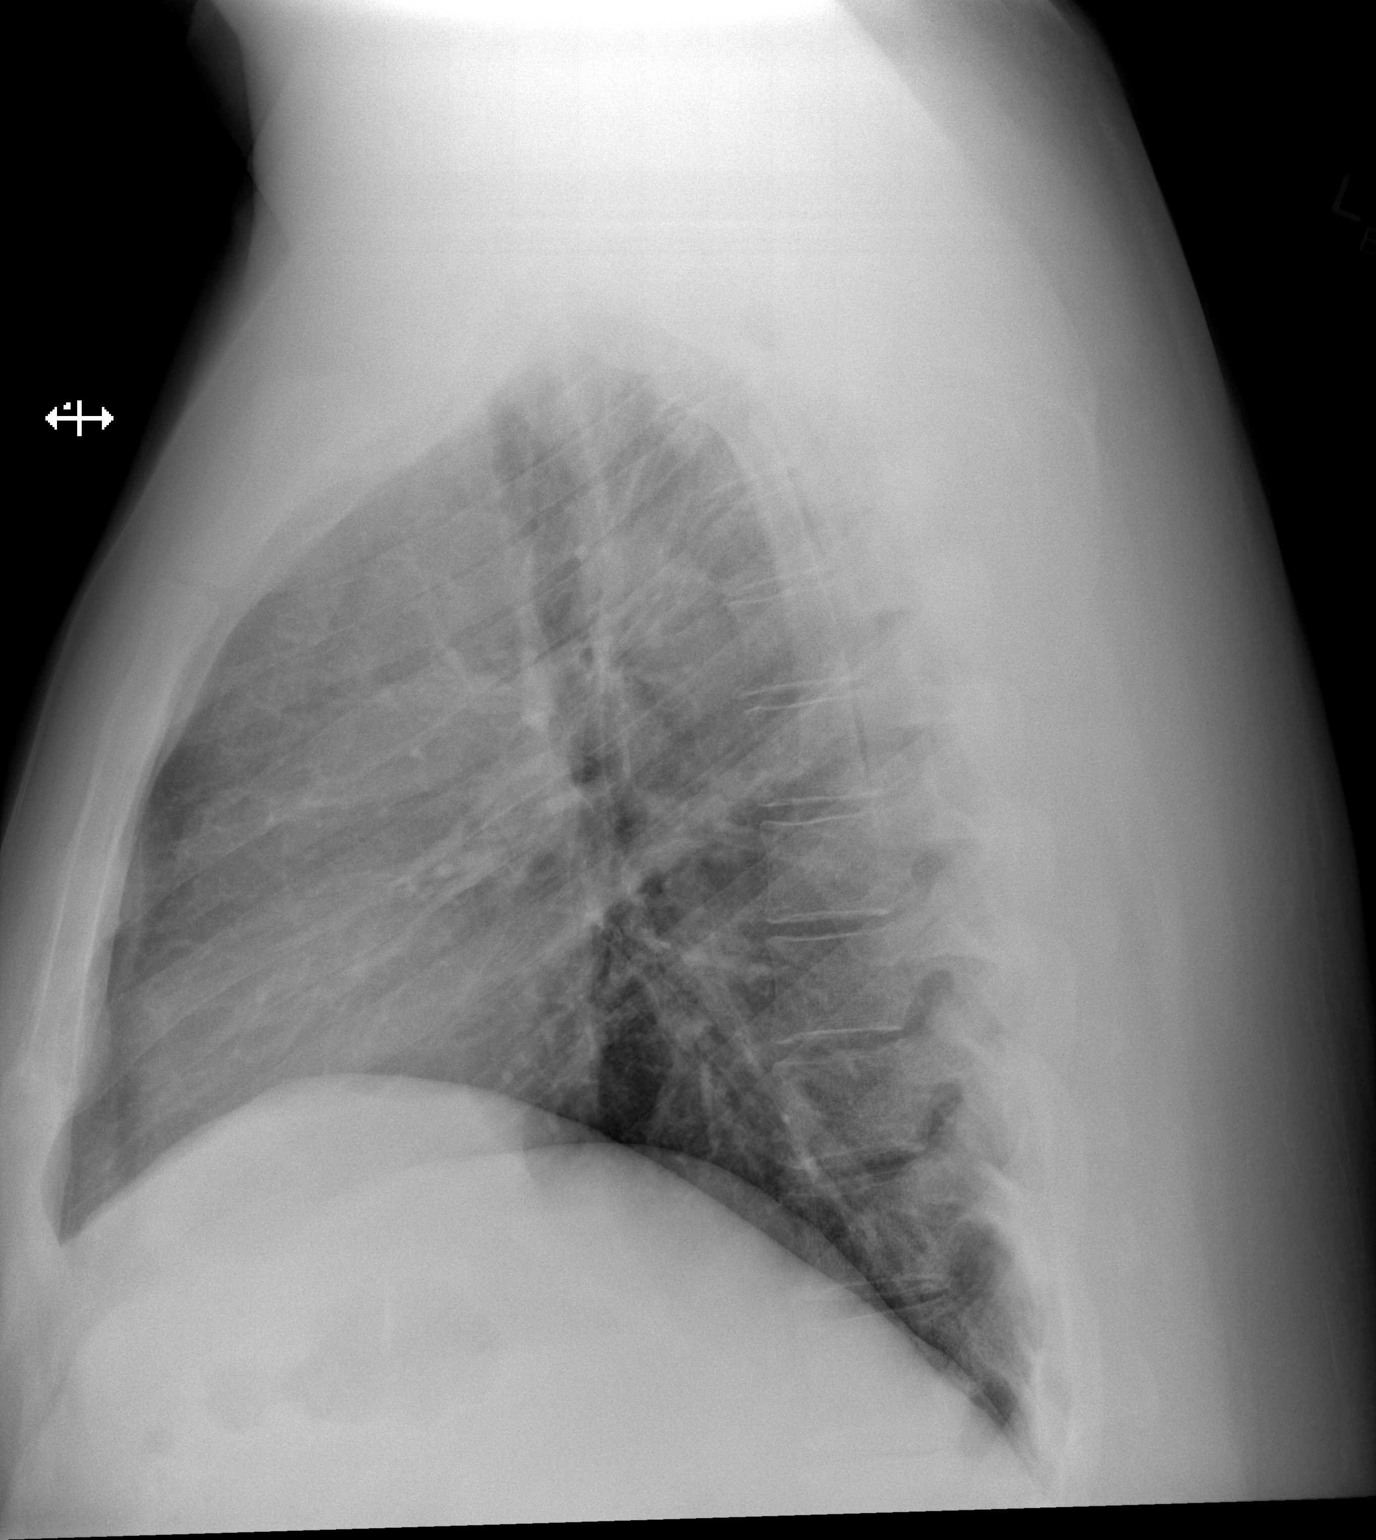

[2 of 2 positions shown; findings below may reference images not displayed]

FINDINGS: Lungs are clear. Heart size and pulmonary vascularity are normal. No
adenopathy. No bone lesions.
IMPRESSION: No edema or consolidation.

## 2019-11-22 IMAGING — CR LUMBAR SPINE - 1 VIEW
1 series · 1 of 1 positions shown · non-contrast
Comparison: None available

CLINICAL DATA: ATTENTION: PATIENT UNDER ANESTHESIA IN OPERATING
ROOM. PLEASE NUMBER THE LEVELS AND COMMENT ON WHERE SMALLER (ARROW)
INSTRUMENTATION IS LOCATED. CALL REPORT TO [phone]. LOCALIZATION
FOR L5-S1.

EXAM:
PORTABLE SPINE - 1 VIEW; LUMBAR SPINE - 1 VIEW

[lateral]
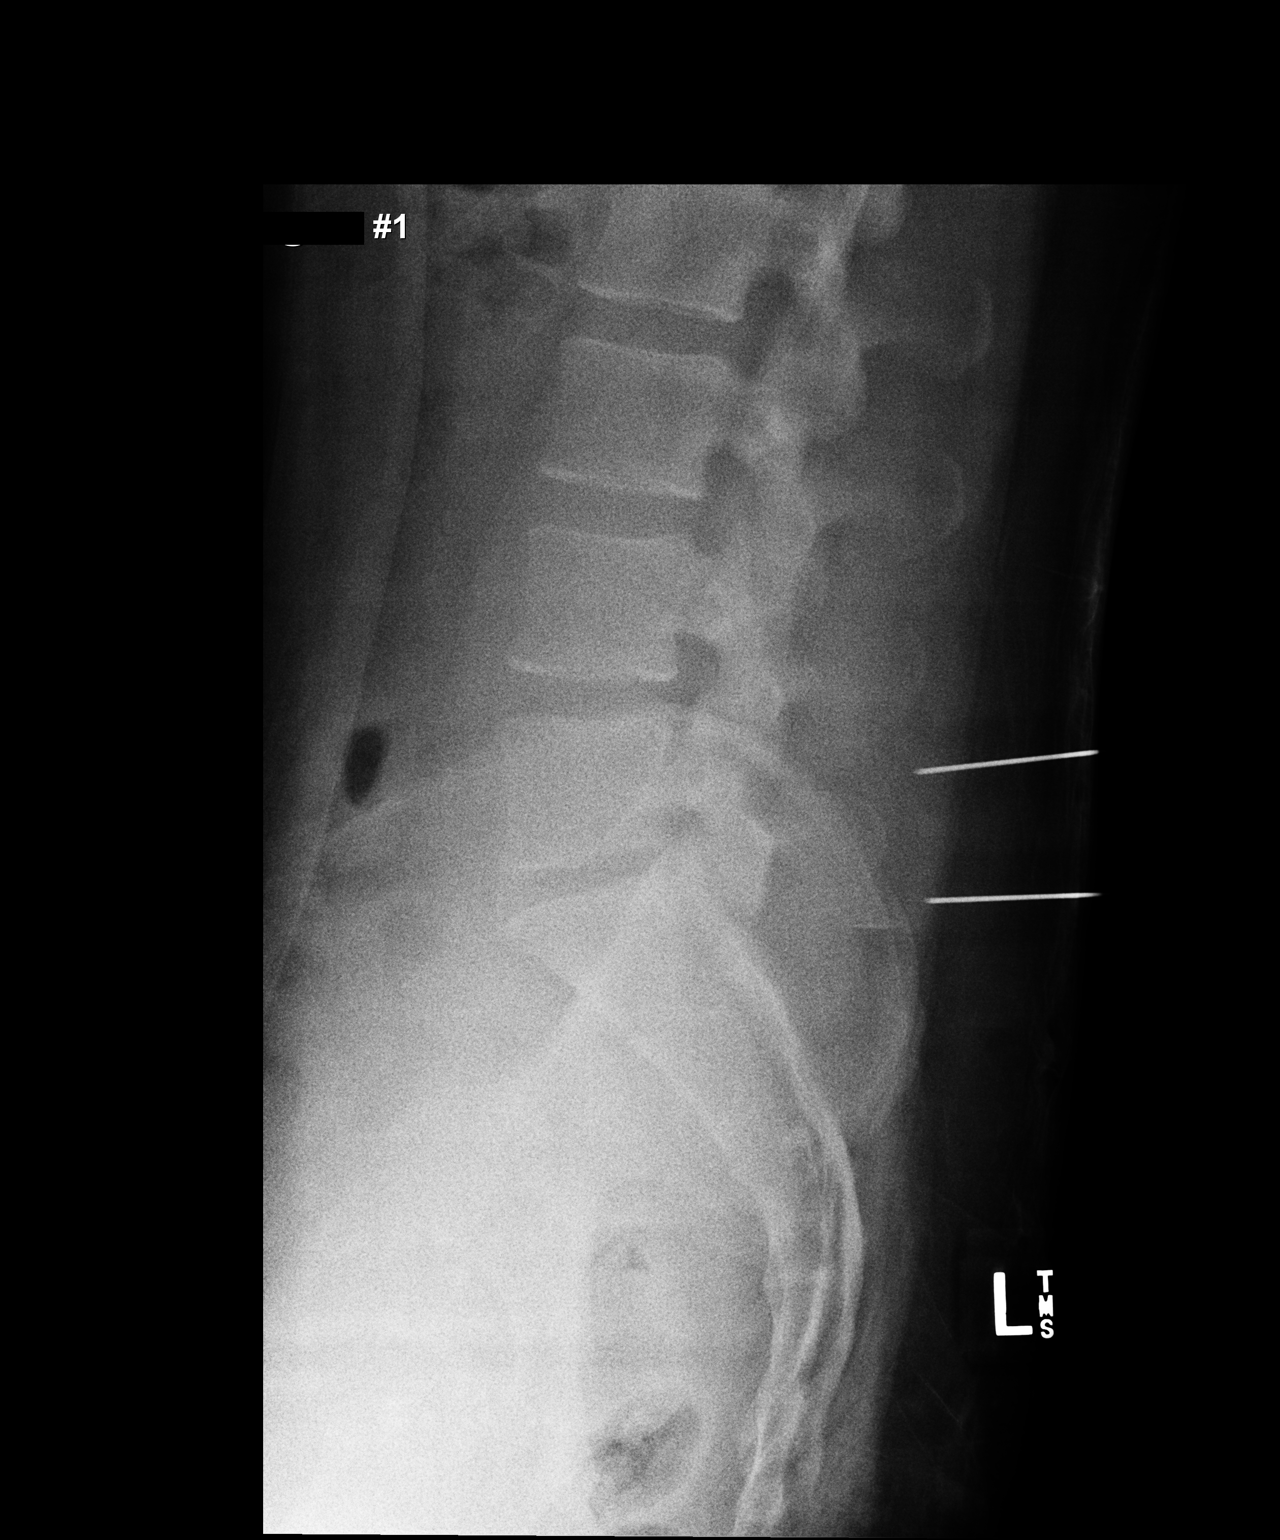

[1 of 1 positions shown; findings below may reference images not displayed]

FINDINGS: In the absence of any prior cross-sectional imaging for correlation,
I will assume the lowest open interspace is L5-S1.

The image marked # 1 shows needle tips projecting over L5 spinous
process and posterior to the first sacral segment.

The image marked # 2 shows posterior surgical instruments, tip of
small probe posterior to the L5-S1 interspace.
IMPRESSION: Intraoperative localization

## 2019-11-25 ENCOUNTER — Other Ambulatory Visit (HOSPITAL_COMMUNITY): Payer: Self-pay | Admitting: Orthopedic Surgery

## 2019-11-25 DIAGNOSIS — M5416 Radiculopathy, lumbar region: Secondary | ICD-10-CM | POA: Diagnosis not present

## 2019-11-25 DIAGNOSIS — M545 Low back pain, unspecified: Secondary | ICD-10-CM | POA: Diagnosis not present

## 2019-11-25 MED FILL — MELOXICAM 7.5 MG TABLET: 7.5 | 30 days supply | Qty: 60 | Fill #0

## 2019-11-25 MED FILL — traMADol HCL 50 MG TABS: 50 | 10 days supply | Qty: 30 | Fill #0

## 2019-11-25 MED FILL — GABAPENTIN 300 MG CAPSULE: 300 | 10 days supply | Qty: 10 | Fill #0

## 2019-12-14 DIAGNOSIS — M5416 Radiculopathy, lumbar region: Secondary | ICD-10-CM | POA: Diagnosis not present

## 2019-12-20 ENCOUNTER — Other Ambulatory Visit (HOSPITAL_COMMUNITY): Payer: Self-pay | Admitting: Orthopedic Surgery

## 2019-12-20 DIAGNOSIS — M5116 Intervertebral disc disorders with radiculopathy, lumbar region: Secondary | ICD-10-CM | POA: Diagnosis not present

## 2019-12-20 MED FILL — GABAPENTIN 300 MG CAPSULE: 300 | 30 days supply | Qty: 90 | Fill #0

## 2019-12-21 DIAGNOSIS — M9905 Segmental and somatic dysfunction of pelvic region: Secondary | ICD-10-CM | POA: Diagnosis not present

## 2019-12-21 DIAGNOSIS — M5116 Intervertebral disc disorders with radiculopathy, lumbar region: Secondary | ICD-10-CM | POA: Diagnosis not present

## 2019-12-21 DIAGNOSIS — M25552 Pain in left hip: Secondary | ICD-10-CM | POA: Diagnosis not present

## 2019-12-21 DIAGNOSIS — M9903 Segmental and somatic dysfunction of lumbar region: Secondary | ICD-10-CM | POA: Diagnosis not present

## 2019-12-26 DIAGNOSIS — M9905 Segmental and somatic dysfunction of pelvic region: Secondary | ICD-10-CM | POA: Diagnosis not present

## 2019-12-26 DIAGNOSIS — M5116 Intervertebral disc disorders with radiculopathy, lumbar region: Secondary | ICD-10-CM | POA: Diagnosis not present

## 2019-12-26 DIAGNOSIS — M25552 Pain in left hip: Secondary | ICD-10-CM | POA: Diagnosis not present

## 2019-12-26 DIAGNOSIS — M9903 Segmental and somatic dysfunction of lumbar region: Secondary | ICD-10-CM | POA: Diagnosis not present

## 2019-12-27 DIAGNOSIS — M5116 Intervertebral disc disorders with radiculopathy, lumbar region: Secondary | ICD-10-CM | POA: Diagnosis not present

## 2019-12-27 DIAGNOSIS — M25552 Pain in left hip: Secondary | ICD-10-CM | POA: Diagnosis not present

## 2019-12-27 DIAGNOSIS — M9903 Segmental and somatic dysfunction of lumbar region: Secondary | ICD-10-CM | POA: Diagnosis not present

## 2019-12-27 DIAGNOSIS — M9905 Segmental and somatic dysfunction of pelvic region: Secondary | ICD-10-CM | POA: Diagnosis not present

## 2019-12-28 DIAGNOSIS — M955 Acquired deformity of pelvis: Secondary | ICD-10-CM | POA: Diagnosis not present

## 2019-12-28 MED FILL — LISINOPRIL-HCTZ 20-12.5 MG: 20-12.5 | 90 days supply | Qty: 90 | Fill #0

## 2019-12-29 DIAGNOSIS — M25552 Pain in left hip: Secondary | ICD-10-CM | POA: Diagnosis not present

## 2019-12-29 DIAGNOSIS — M5116 Intervertebral disc disorders with radiculopathy, lumbar region: Secondary | ICD-10-CM | POA: Diagnosis not present

## 2019-12-29 DIAGNOSIS — M9903 Segmental and somatic dysfunction of lumbar region: Secondary | ICD-10-CM | POA: Diagnosis not present

## 2019-12-29 DIAGNOSIS — M9905 Segmental and somatic dysfunction of pelvic region: Secondary | ICD-10-CM | POA: Diagnosis not present

## 2020-01-02 DIAGNOSIS — M9903 Segmental and somatic dysfunction of lumbar region: Secondary | ICD-10-CM | POA: Diagnosis not present

## 2020-01-02 DIAGNOSIS — M9905 Segmental and somatic dysfunction of pelvic region: Secondary | ICD-10-CM | POA: Diagnosis not present

## 2020-01-02 DIAGNOSIS — M25552 Pain in left hip: Secondary | ICD-10-CM | POA: Diagnosis not present

## 2020-01-02 DIAGNOSIS — M5116 Intervertebral disc disorders with radiculopathy, lumbar region: Secondary | ICD-10-CM | POA: Diagnosis not present

## 2020-01-03 DIAGNOSIS — M5417 Radiculopathy, lumbosacral region: Secondary | ICD-10-CM | POA: Diagnosis not present

## 2020-01-03 DIAGNOSIS — M435X7 Other recurrent vertebral dislocation, lumbosacral region: Secondary | ICD-10-CM | POA: Diagnosis not present

## 2020-01-04 DIAGNOSIS — M9903 Segmental and somatic dysfunction of lumbar region: Secondary | ICD-10-CM | POA: Diagnosis not present

## 2020-01-04 DIAGNOSIS — M25552 Pain in left hip: Secondary | ICD-10-CM | POA: Diagnosis not present

## 2020-01-04 DIAGNOSIS — M5116 Intervertebral disc disorders with radiculopathy, lumbar region: Secondary | ICD-10-CM | POA: Diagnosis not present

## 2020-01-04 DIAGNOSIS — M9905 Segmental and somatic dysfunction of pelvic region: Secondary | ICD-10-CM | POA: Diagnosis not present

## 2020-01-09 DIAGNOSIS — M9903 Segmental and somatic dysfunction of lumbar region: Secondary | ICD-10-CM | POA: Diagnosis not present

## 2020-01-09 DIAGNOSIS — M9905 Segmental and somatic dysfunction of pelvic region: Secondary | ICD-10-CM | POA: Diagnosis not present

## 2020-01-09 DIAGNOSIS — M25552 Pain in left hip: Secondary | ICD-10-CM | POA: Diagnosis not present

## 2020-01-09 DIAGNOSIS — M5116 Intervertebral disc disorders with radiculopathy, lumbar region: Secondary | ICD-10-CM | POA: Diagnosis not present

## 2020-01-11 DIAGNOSIS — M9903 Segmental and somatic dysfunction of lumbar region: Secondary | ICD-10-CM | POA: Diagnosis not present

## 2020-01-11 DIAGNOSIS — M25552 Pain in left hip: Secondary | ICD-10-CM | POA: Diagnosis not present

## 2020-01-11 DIAGNOSIS — M9905 Segmental and somatic dysfunction of pelvic region: Secondary | ICD-10-CM | POA: Diagnosis not present

## 2020-01-11 DIAGNOSIS — M5116 Intervertebral disc disorders with radiculopathy, lumbar region: Secondary | ICD-10-CM | POA: Diagnosis not present

## 2020-01-16 DIAGNOSIS — M5116 Intervertebral disc disorders with radiculopathy, lumbar region: Secondary | ICD-10-CM | POA: Diagnosis not present

## 2020-01-16 DIAGNOSIS — M25552 Pain in left hip: Secondary | ICD-10-CM | POA: Diagnosis not present

## 2020-01-16 DIAGNOSIS — M9905 Segmental and somatic dysfunction of pelvic region: Secondary | ICD-10-CM | POA: Diagnosis not present

## 2020-01-16 DIAGNOSIS — M9903 Segmental and somatic dysfunction of lumbar region: Secondary | ICD-10-CM | POA: Diagnosis not present

## 2020-01-18 DIAGNOSIS — M5116 Intervertebral disc disorders with radiculopathy, lumbar region: Secondary | ICD-10-CM | POA: Diagnosis not present

## 2020-01-18 DIAGNOSIS — M9905 Segmental and somatic dysfunction of pelvic region: Secondary | ICD-10-CM | POA: Diagnosis not present

## 2020-01-18 DIAGNOSIS — M25552 Pain in left hip: Secondary | ICD-10-CM | POA: Diagnosis not present

## 2020-01-18 DIAGNOSIS — M9903 Segmental and somatic dysfunction of lumbar region: Secondary | ICD-10-CM | POA: Diagnosis not present

## 2020-01-24 DIAGNOSIS — M9905 Segmental and somatic dysfunction of pelvic region: Secondary | ICD-10-CM | POA: Diagnosis not present

## 2020-01-24 DIAGNOSIS — M5116 Intervertebral disc disorders with radiculopathy, lumbar region: Secondary | ICD-10-CM | POA: Diagnosis not present

## 2020-01-24 DIAGNOSIS — M25552 Pain in left hip: Secondary | ICD-10-CM | POA: Diagnosis not present

## 2020-01-24 DIAGNOSIS — M9903 Segmental and somatic dysfunction of lumbar region: Secondary | ICD-10-CM | POA: Diagnosis not present

## 2020-01-26 DIAGNOSIS — M5116 Intervertebral disc disorders with radiculopathy, lumbar region: Secondary | ICD-10-CM | POA: Diagnosis not present

## 2020-01-26 DIAGNOSIS — M9905 Segmental and somatic dysfunction of pelvic region: Secondary | ICD-10-CM | POA: Diagnosis not present

## 2020-01-26 DIAGNOSIS — M9903 Segmental and somatic dysfunction of lumbar region: Secondary | ICD-10-CM | POA: Diagnosis not present

## 2020-01-26 DIAGNOSIS — M25552 Pain in left hip: Secondary | ICD-10-CM | POA: Diagnosis not present

## 2020-02-01 DIAGNOSIS — M9903 Segmental and somatic dysfunction of lumbar region: Secondary | ICD-10-CM | POA: Diagnosis not present

## 2020-02-01 DIAGNOSIS — M25552 Pain in left hip: Secondary | ICD-10-CM | POA: Diagnosis not present

## 2020-02-01 DIAGNOSIS — M5116 Intervertebral disc disorders with radiculopathy, lumbar region: Secondary | ICD-10-CM | POA: Diagnosis not present

## 2020-02-01 DIAGNOSIS — M9905 Segmental and somatic dysfunction of pelvic region: Secondary | ICD-10-CM | POA: Diagnosis not present

## 2020-02-02 DIAGNOSIS — M5116 Intervertebral disc disorders with radiculopathy, lumbar region: Secondary | ICD-10-CM | POA: Diagnosis not present

## 2020-02-02 DIAGNOSIS — M25552 Pain in left hip: Secondary | ICD-10-CM | POA: Diagnosis not present

## 2020-02-02 DIAGNOSIS — M9905 Segmental and somatic dysfunction of pelvic region: Secondary | ICD-10-CM | POA: Diagnosis not present

## 2020-02-02 DIAGNOSIS — M9903 Segmental and somatic dysfunction of lumbar region: Secondary | ICD-10-CM | POA: Diagnosis not present

## 2020-02-08 DIAGNOSIS — M9905 Segmental and somatic dysfunction of pelvic region: Secondary | ICD-10-CM | POA: Diagnosis not present

## 2020-02-08 DIAGNOSIS — M9903 Segmental and somatic dysfunction of lumbar region: Secondary | ICD-10-CM | POA: Diagnosis not present

## 2020-02-08 DIAGNOSIS — M5116 Intervertebral disc disorders with radiculopathy, lumbar region: Secondary | ICD-10-CM | POA: Diagnosis not present

## 2020-02-08 DIAGNOSIS — M25552 Pain in left hip: Secondary | ICD-10-CM | POA: Diagnosis not present

## 2020-02-14 DIAGNOSIS — M9905 Segmental and somatic dysfunction of pelvic region: Secondary | ICD-10-CM | POA: Diagnosis not present

## 2020-02-14 DIAGNOSIS — M5116 Intervertebral disc disorders with radiculopathy, lumbar region: Secondary | ICD-10-CM | POA: Diagnosis not present

## 2020-02-14 DIAGNOSIS — M25552 Pain in left hip: Secondary | ICD-10-CM | POA: Diagnosis not present

## 2020-02-14 DIAGNOSIS — M9903 Segmental and somatic dysfunction of lumbar region: Secondary | ICD-10-CM | POA: Diagnosis not present

## 2020-02-16 DIAGNOSIS — M25552 Pain in left hip: Secondary | ICD-10-CM | POA: Diagnosis not present

## 2020-02-16 DIAGNOSIS — M9903 Segmental and somatic dysfunction of lumbar region: Secondary | ICD-10-CM | POA: Diagnosis not present

## 2020-02-16 DIAGNOSIS — M5116 Intervertebral disc disorders with radiculopathy, lumbar region: Secondary | ICD-10-CM | POA: Diagnosis not present

## 2020-02-16 DIAGNOSIS — M9905 Segmental and somatic dysfunction of pelvic region: Secondary | ICD-10-CM | POA: Diagnosis not present

## 2020-02-21 DIAGNOSIS — M5116 Intervertebral disc disorders with radiculopathy, lumbar region: Secondary | ICD-10-CM | POA: Diagnosis not present

## 2020-02-21 DIAGNOSIS — M9903 Segmental and somatic dysfunction of lumbar region: Secondary | ICD-10-CM | POA: Diagnosis not present

## 2020-02-21 DIAGNOSIS — M25552 Pain in left hip: Secondary | ICD-10-CM | POA: Diagnosis not present

## 2020-02-21 DIAGNOSIS — M9905 Segmental and somatic dysfunction of pelvic region: Secondary | ICD-10-CM | POA: Diagnosis not present

## 2020-02-28 DIAGNOSIS — M9905 Segmental and somatic dysfunction of pelvic region: Secondary | ICD-10-CM | POA: Diagnosis not present

## 2020-02-28 DIAGNOSIS — M5116 Intervertebral disc disorders with radiculopathy, lumbar region: Secondary | ICD-10-CM | POA: Diagnosis not present

## 2020-02-28 DIAGNOSIS — M9903 Segmental and somatic dysfunction of lumbar region: Secondary | ICD-10-CM | POA: Diagnosis not present

## 2020-02-28 DIAGNOSIS — M25552 Pain in left hip: Secondary | ICD-10-CM | POA: Diagnosis not present

## 2020-04-10 ENCOUNTER — Other Ambulatory Visit (HOSPITAL_COMMUNITY): Payer: Self-pay | Admitting: Sports Medicine

## 2020-04-10 DIAGNOSIS — R7303 Prediabetes: Secondary | ICD-10-CM | POA: Diagnosis not present

## 2020-04-12 MED FILL — METFORMIN HCL 500 MG TABS: 500 | 90 days supply | Qty: 180 | Fill #0

## 2020-04-12 MED FILL — METFORMIN HCL 500 MG TABS: 500 | 90 days supply | Qty: 180 | Fill #0 | Status: TO

## 2020-04-13 ENCOUNTER — Other Ambulatory Visit (HOSPITAL_COMMUNITY): Payer: Self-pay | Admitting: Medical Oncology

## 2020-04-13 ENCOUNTER — Encounter (HOSPITAL_COMMUNITY): Payer: Self-pay | Admitting: Emergency Medicine

## 2020-04-13 ENCOUNTER — Ambulatory Visit (HOSPITAL_COMMUNITY)
Admission: EM | Admit: 2020-04-13 | Discharge: 2020-04-13 | Disposition: A | Discharge status: Home / Self Care | Payer: 59

## 2020-04-13 ENCOUNTER — Other Ambulatory Visit: Payer: Self-pay

## 2020-04-13 DIAGNOSIS — L03115 Cellulitis of right lower limb: Secondary | ICD-10-CM | POA: Diagnosis not present

## 2020-04-13 HISTORY — DX: Type 2 diabetes mellitus without complications: E11.9

## 2020-04-13 HISTORY — DX: Essential (primary) hypertension: I10

## 2020-04-13 MED ORDER — DOXYCYCLINE HYCLATE 100 MG PO CAPS: 100.0000 mg | ORAL_CAPSULE | Freq: Two times a day (BID) | ORAL | 0 refills | Status: DC

## 2020-04-13 MED FILL — DOXYCYCLINE HYCLATE 100 MG: 100 | 10 days supply | Qty: 20 | Fill #0

## 2020-04-13 NOTE — ED Triage Notes (Addendum)
Pt presents with wound on right calf after falling down steps on 2/07. States has redness but no warmth to touch or drainage.

## 2020-04-13 NOTE — ED Provider Notes (Signed)
[facility]    CSN: [id number] Arrival date & time: 04/13/20  1239      History   Chief Complaint Chief Complaint  Patient presents with  . Wound Check    HPI Kyle [name] is a 40 y.o. male.   HPI   Redness of skin: Patient reports that at the beginning of February he fell coming down the stairs and bruised the right side of his leg.  He thought little of this as there was no breakdown of the skin that he could see in the area appeared to improve however for the past 2 to 3 weeks he has noticed a redness of this area that has slowly grown and is becoming painful.  He has not tried anything for symptoms other than to avoid bumping this area. No fevers, MRSA history, or discharge.   Past Medical History:  Diagnosis Date  . Diabetes mellitus without complication (HCC)   . Hypertension     There are no problems to display for this patient.   History reviewed. No pertinent surgical history.     Home Medications    Prior to Admission medications   Medication Sig Start Date End Date Taking? Authorizing Provider  doxycycline (VIBRAMYCIN) 100 MG capsule Take 1 capsule (100 mg total) by mouth 2 (two) times daily. 04/13/20  Yes Hero Mccathern M, PA-C  lisinopril-hydrochlorothiazide (ZESTORETIC) 20-12.5 MG tablet Take 1 tablet by mouth daily. 03/29/20   [provider]  metFORMIN (GLUCOPHAGE) 500 MG tablet Take 500 mg by mouth 2 (two) times daily. 04/12/20   [provider]    Family History Family History  Problem Relation Age of Onset  . Hypertension Father   . Diabetes Father     Social History Social History   Tobacco Use  . Smoking status: Never Smoker  . Smokeless tobacco: Never Used  Substance Use Topics  . Alcohol use: Yes     Allergies   Patient has no known allergies.   Review of Systems Review of Systems  As stated above in HPI Physical Exam Triage Vital Signs ED Triage Vitals  Enc Vitals Group     BP 04/13/20 1427  120/80     Pulse Rate 04/13/20 1427 88     Resp 04/13/20 1427 17     Temp 04/13/20 1427 98.1 F (36.7 C)     Temp Source 04/13/20 1427 Oral     SpO2 04/13/20 1427 100 %     Weight --      Height --      Head Circumference --      Peak Flow --      Pain Score 04/13/20 1424 0     Pain Loc --      Pain Edu? --      Excl. in [place]? --    No data found.  Updated Vital Signs BP 120/80 (BP Location: Left Arm)   Pulse 88   Temp 98.1 F (36.7 C) (Oral)   Resp 17   SpO2 100%   Physical Exam Vitals and nursing note reviewed.  Constitutional:      General: He is not in acute distress.    Appearance: Normal appearance. He is not ill-appearing, toxic-appearing or diaphoretic.  Musculoskeletal:     Right lower leg: No edema.     Left lower leg: No edema.  Skin:    General: Skin is warm.     Comments: There is a 2 inch round area of erythema and  fluctuance of the right lateral lower leg about 5 inches distal to the knee.  Increased warmth to palpation.  No other lesions.  Neurological:     Mental Status: He is alert.      UC Treatments / Results  Labs (all labs ordered are listed, but only abnormal results are displayed) Labs Reviewed - No data to display  EKG   Radiology No results found.  Procedures Procedures (including critical care time)  Medications Ordered in UC Medications - No data to display  Initial Impression / Assessment and Plan / UC Course  I have reviewed the triage vital signs and the nursing notes.  Pertinent labs & imaging results that were available during my care of the patient were reviewed by me and considered in my medical decision making (see chart for details).    New.  Treating with doxycycline to prevent further spread of infection and systemic disease.  Discussed how to take this medication along with common potential side effects and precautions. At this time there does not appear to be an abscess that needs to be drained.  Close  monitoring.  Discussed red flag signs and symptoms.   Final Clinical Impressions(s) / UC Diagnoses   Final diagnoses:  Cellulitis of right lower extremity   Discharge Instructions   None    ED Prescriptions    Medication Sig Dispense Auth. Provider   doxycycline (VIBRAMYCIN) 100 MG capsule Take 1 capsule (100 mg total) by mouth 2 (two) times daily. 20 capsule Rushie Chestnut, [place]     PDMP not reviewed this encounter.   Rushie Chestnut, [place] 04/13/20 1443

## 2020-04-16 ENCOUNTER — Encounter (HOSPITAL_COMMUNITY): Payer: Self-pay

## 2020-04-18 ENCOUNTER — Other Ambulatory Visit (HOSPITAL_COMMUNITY): Payer: Self-pay

## 2020-04-18 DIAGNOSIS — I10 Essential (primary) hypertension: Secondary | ICD-10-CM | POA: Diagnosis not present

## 2020-04-18 DIAGNOSIS — E6609 Other obesity due to excess calories: Secondary | ICD-10-CM | POA: Diagnosis not present

## 2020-04-18 DIAGNOSIS — R7303 Prediabetes: Secondary | ICD-10-CM | POA: Diagnosis not present

## 2020-04-18 MED ORDER — METFORMIN HCL 1000 MG PO TABS
1000.0000 mg | ORAL_TABLET | Freq: Two times a day (BID) | ORAL | 1 refills | Status: DC
  Filled 2020-04-18 – 2020-11-30 (×2): qty 60, 30d supply, fill #0

## 2020-04-18 MED ORDER — METFORMIN HCL 1000 MG PO TABS
1000.0000 mg | ORAL_TABLET | Freq: Two times a day (BID) | ORAL | 1 refills | Status: DC
  Filled 2020-04-18: qty 180, 90d supply, fill #0

## 2020-07-07 ENCOUNTER — Other Ambulatory Visit (HOSPITAL_COMMUNITY): Payer: Self-pay

## 2020-07-07 ENCOUNTER — Other Ambulatory Visit: Payer: Self-pay | Admitting: Sports Medicine

## 2020-07-10 ENCOUNTER — Other Ambulatory Visit (HOSPITAL_COMMUNITY): Payer: Self-pay

## 2020-07-10 ENCOUNTER — Other Ambulatory Visit: Payer: Self-pay | Admitting: Sports Medicine

## 2020-07-11 ENCOUNTER — Other Ambulatory Visit (HOSPITAL_COMMUNITY): Payer: Self-pay

## 2020-07-11 MED ORDER — LISINOPRIL-HYDROCHLOROTHIAZIDE 20-12.5 MG PO TABS
1.0000 | ORAL_TABLET | Freq: Every day | ORAL | 1 refills | Status: DC
  Filled 2020-07-11: qty 90, 90d supply, fill #0
  Filled 2021-01-16: qty 90, 90d supply, fill #1

## 2020-07-13 ENCOUNTER — Other Ambulatory Visit (HOSPITAL_COMMUNITY): Payer: Self-pay

## 2020-09-10 DIAGNOSIS — H43392 Other vitreous opacities, left eye: Secondary | ICD-10-CM | POA: Diagnosis not present

## 2020-09-10 DIAGNOSIS — I10 Essential (primary) hypertension: Secondary | ICD-10-CM | POA: Diagnosis not present

## 2020-09-10 DIAGNOSIS — H35352 Cystoid macular degeneration, left eye: Secondary | ICD-10-CM | POA: Diagnosis not present

## 2020-09-10 DIAGNOSIS — E119 Type 2 diabetes mellitus without complications: Secondary | ICD-10-CM | POA: Diagnosis not present

## 2020-09-10 DIAGNOSIS — E113213 Type 2 diabetes mellitus with mild nonproliferative diabetic retinopathy with macular edema, bilateral: Secondary | ICD-10-CM | POA: Diagnosis not present

## 2020-09-10 DIAGNOSIS — H52222 Regular astigmatism, left eye: Secondary | ICD-10-CM | POA: Diagnosis not present

## 2020-09-10 DIAGNOSIS — H35033 Hypertensive retinopathy, bilateral: Secondary | ICD-10-CM | POA: Diagnosis not present

## 2020-09-10 DIAGNOSIS — H5213 Myopia, bilateral: Secondary | ICD-10-CM | POA: Diagnosis not present

## 2020-10-16 ENCOUNTER — Other Ambulatory Visit (HOSPITAL_COMMUNITY): Payer: Self-pay

## 2020-10-16 DIAGNOSIS — I10 Essential (primary) hypertension: Secondary | ICD-10-CM | POA: Diagnosis not present

## 2020-10-16 DIAGNOSIS — E119 Type 2 diabetes mellitus without complications: Secondary | ICD-10-CM | POA: Diagnosis not present

## 2020-10-16 DIAGNOSIS — E6609 Other obesity due to excess calories: Secondary | ICD-10-CM | POA: Diagnosis not present

## 2020-10-16 DIAGNOSIS — E782 Mixed hyperlipidemia: Secondary | ICD-10-CM | POA: Diagnosis not present

## 2020-10-16 MED ORDER — LISINOPRIL-HYDROCHLOROTHIAZIDE 20-12.5 MG PO TABS
1.0000 | ORAL_TABLET | Freq: Every day | ORAL | 1 refills | Status: DC
  Filled 2020-10-16 – 2021-04-30 (×2): qty 90, 90d supply, fill #0

## 2020-10-17 DIAGNOSIS — E782 Mixed hyperlipidemia: Secondary | ICD-10-CM | POA: Diagnosis not present

## 2020-10-17 DIAGNOSIS — I10 Essential (primary) hypertension: Secondary | ICD-10-CM | POA: Diagnosis not present

## 2020-10-17 DIAGNOSIS — E6609 Other obesity due to excess calories: Secondary | ICD-10-CM | POA: Diagnosis not present

## 2020-10-17 DIAGNOSIS — E119 Type 2 diabetes mellitus without complications: Secondary | ICD-10-CM | POA: Diagnosis not present

## 2020-11-17 ENCOUNTER — Encounter (HOSPITAL_COMMUNITY): Payer: Self-pay

## 2020-11-17 ENCOUNTER — Ambulatory Visit (HOSPITAL_COMMUNITY)
Admission: EM | Admit: 2020-11-17 | Discharge: 2020-11-17 | Disposition: A | Discharge status: Home / Self Care | Payer: 59 | Attending: Family Medicine | Admitting: Family Medicine

## 2020-11-17 ENCOUNTER — Other Ambulatory Visit: Payer: Self-pay

## 2020-11-17 DIAGNOSIS — Y7711 Contact lens associated with adverse incidents: Secondary | ICD-10-CM | POA: Diagnosis not present

## 2020-11-17 DIAGNOSIS — H1089 Other conjunctivitis: Secondary | ICD-10-CM

## 2020-11-17 MED ORDER — CIPROFLOXACIN HCL 0.3 % OP SOLN: 1.0000 [drp] | OPHTHALMIC | 0 refills | Status: DC

## 2020-11-17 NOTE — ED Triage Notes (Signed)
Pt reports left eye blurry vision since this morning. Sates he uses contact lens.

## 2020-11-17 NOTE — ED Provider Notes (Signed)
[facility]    CSN: [id number] Arrival date & time: 11/17/20  1527      History   Chief Complaint Chief Complaint  Patient presents with   Eye Problem    HPI Kyle [name] is a 40 y.o. male.   Presenting today with 1 day history of left eye redness, drainage, irritation.  States something similar happened about 6 weeks ago but he was able to ease it with cool compresses and avoiding wearing his contact lenses.  Stopped using his contact lenses after onset of symptoms this morning.  He denies headaches, nausea, vomiting, loss of vision, fever, injury to the eye.  So far, trying anything for symptoms.   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 03/02/2018   Class 2 obesity due to excess calories without serious comorbidity with body mass index (BMI) of 39.0 to 39.9 in adult 03/04/2017   HTN, goal below 140/90 11/19/2015    Class: Chronic   Pilonidal disease s/p excision 12/04/2017 11/19/2015   Blood glucose elevated 11/19/2015    Past Surgical History:  Procedure Laterality Date   ABCESS DRAINAGE     Pilonidal cyst   LUMBAR LAMINECTOMY/DECOMPRESSION MICRODISCECTOMY Left 04/08/2018   Procedure: Left L5-S1 discectomy;  Surgeon: Venita Lick, MD;  Location: [facility];  Service: Orthopedics;  Laterality: Left;  120 mins   PILONIDAL CYST EXCISION N/A 12/04/2017   Procedure: EXCISION OF PILONIDAL DISEASE;  Surgeon: Karie Soda, MD;  Location: [facility];  Service: General;  Laterality: N/A;   WISDOM TOOTH EXTRACTION         Home Medications    Prior to Admission medications   Medication Sig Start Date End  Date Taking? Authorizing Provider  ciprofloxacin (CILOXAN) 0.3 % ophthalmic solution Place 1 drop into the left eye every 4 (four) hours while awake. Administer 1 drop, every 2 hours, while awake, for 2 days. Then 1 drop, every 4 hours, while awake, for the next 5 days. 11/17/20  Yes Particia Nearing, PA-C  doxycycline (VIBRAMYCIN) 100 MG capsule TAKE 1 CAPSULE BY MOUTH TWICE DAILY. 04/13/20 04/13/21  Rushie Chestnut, PA-C  gabapentin (NEURONTIN) 300 MG capsule TAKE 1 CAPSULE 3 TIMES A DAY BY MOUTH 12/20/19 12/19/20  Rhodia Albright, PA-C  gabapentin (NEURONTIN) 300 MG capsule TAKE 1 CAPSULE BY MOUTH DAILY FOR 10 DAYS 11/25/19 11/24/20  Venita Lick, MD  lisinopril-hydrochlorothiazide (PRINZIDE,ZESTORETIC) 20-12.5 MG tablet Take 1 tablet by mouth daily. 04/05/18   Andrena Mews, DO  lisinopril-hydrochlorothiazide (ZESTORETIC) 20-12.5 MG tablet Take 1 tablet by mouth daily. 07/11/20     lisinopril-hydrochlorothiazide (ZESTORETIC) 20-12.5 MG tablet Take 1 tablet by mouth daily. 10/16/20     lisinopril-hydrochlorothiazide (ZESTORETIC) 20-12.5 MG tablet Take 1 tablet by mouth daily. 03/29/20   [provider]  meloxicam (MOBIC) 7.5 MG tablet TAKE 1 TABLET BY MOUTH TWO TIMES DAILY AS NEEDED 11/25/19 11/24/20  Venita Lick, MD  metFORMIN (GLUCOPHAGE) 1000 MG tablet Take 1 tablet by mouth twice a day 04/18/20     metFORMIN (GLUCOPHAGE) 1000 MG tablet Take 1 tablet (1,000 mg total)  by mouth 2 (two) times daily. 04/18/20     metFORMIN (GLUCOPHAGE) 500 MG tablet Take 1 tablet (500 mg total) by mouth 2 (two) times daily with a meal. Start when discharged from Hospital. 04/08/18   Andrena Mews, DO  metFORMIN (GLUCOPHAGE) 500 MG tablet Take 500 mg by mouth 2 (two) times daily. 04/12/20   [provider]  metFORMIN (GLUCOPHAGE) 500 MG tablet TAKE 1 TABLET BY MOUTH 2 TIMES DAILY 04/10/20 04/10/21  [name] D, DO  metFORMIN (GLUCOPHAGE) 500 MG tablet TAKE 1 TABLET BY MOUTH TWO TIMES DAILY  06/17/19 06/16/20  Andrena Mews, DO  ondansetron (ZOFRAN) 4 MG tablet Take 1 tablet (4 mg total) by mouth every 8 (eight) hours as needed for nausea or vomiting. 04/08/18   Venita Lick, MD    Family History Family History  Problem Relation Age of Onset   Hypertension Father    Diabetes Father    Inflammatory bowel disease Sister     Social History Social History   Tobacco Use   Smoking status: Never   Smokeless tobacco: Never   Tobacco comments:    once monthly  Vaping Use   Vaping Use: Never used  Substance Use Topics   Alcohol use: Yes   Drug use: No     Allergies   Patient has no known allergies.   Review of Systems Review of Systems Per HPI  Physical Exam Triage Vital Signs ED Triage Vitals  Enc Vitals Group     BP 11/17/20 1711 123/87     Pulse Rate 11/17/20 1711 89     Resp 11/17/20 1711 18     Temp 11/17/20 1711 98.9 F (37.2 C)     Temp Source 11/17/20 1711 Oral     SpO2 11/17/20 1711 95 %     Weight --      Height --      Head Circumference --      Peak Flow --      Pain Score 11/17/20 1710 0     Pain Loc --      Pain Edu? --      Excl. in [place]? --    No data found.  Updated Vital Signs BP 123/87 (BP Location: Right Arm)   Pulse 89   Temp 98.9 F (37.2 C) (Oral)   Resp 18   SpO2 95%   Visual Acuity Right Eye Distance: 20/25 (With correction) Left Eye Distance: CF 10" (With correction) Bilateral Distance: 20/25 (With correction)  Right Eye Near:   Left Eye Near:    Bilateral Near:     Physical Exam Vitals and nursing note reviewed.  Constitutional:      Appearance: Normal appearance.  HENT:     Head: Atraumatic.     Mouth/Throat:     Mouth: Mucous membranes are moist.  Eyes:     General:        Left eye: Discharge present.    Extraocular Movements: Extraocular movements intact.     Pupils: Pupils are equal, round, and reactive to light.     Comments: Left conjunctiva diffusely erythematous, clear discharge present.  No  foreign body noted on exam.  Cardiovascular:     Rate and Rhythm: Normal rate and regular rhythm.  Pulmonary:     Effort: Pulmonary effort is normal.     Breath sounds: Normal breath sounds.  Musculoskeletal:        General: Normal range of motion.     Cervical back:  Normal range of motion and neck supple.  Skin:    General: Skin is warm and dry.  Neurological:     General: No focal deficit present.     Mental Status: He is oriented to person, place, and time.  Psychiatric:        Mood and Affect: Mood normal.        Thought Content: Thought content normal.        Judgment: Judgment normal.   UC Treatments / Results  Labs (all labs ordered are listed, but only abnormal results are displayed) Labs Reviewed - No data to display  EKG   Radiology No results found.  Procedures Procedures (including critical care time)  Medications Ordered in UC Medications - No data to display  Initial Impression / Assessment and Plan / UC Course  I have reviewed the triage vital signs and the nursing notes.  Pertinent labs & imaging results that were available during my care of the patient were reviewed by me and considered in my medical decision making (see chart for details).     Vitals and exam reassuring, visual acuity showing decreases in the left eyes secondary to irritation, photophobia.  Suspect contact lens conjunctivitis.  Will treat with ciprofloxacin drops, warm compresses, avoidance of contact lenses.  Close ophthalmology follow-up if not significantly improving by Monday.  ED for acutely worsening symptoms.  Final Clinical Impressions(s) / UC Diagnoses   Final diagnoses:  Contact lens related conjunctivitis   Discharge Instructions   None    ED Prescriptions     Medication Sig Dispense Auth. Provider   ciprofloxacin (CILOXAN) 0.3 % ophthalmic solution Place 1 drop into the left eye every 4 (four) hours while awake. Administer 1 drop, every 2 hours, while awake, for 2  days. Then 1 drop, every 4 hours, while awake, for the next 5 days. 5 mL Particia Nearing, [place]      PDMP not reviewed this encounter.   Particia Nearing, [place] 11/17/20 1752

## 2020-11-19 ENCOUNTER — Other Ambulatory Visit (HOSPITAL_COMMUNITY): Payer: Self-pay

## 2020-11-19 DIAGNOSIS — S0502XA Injury of conjunctiva and corneal abrasion without foreign body, left eye, initial encounter: Secondary | ICD-10-CM | POA: Diagnosis not present

## 2020-11-19 DIAGNOSIS — H5213 Myopia, bilateral: Secondary | ICD-10-CM | POA: Diagnosis not present

## 2020-11-19 DIAGNOSIS — H52222 Regular astigmatism, left eye: Secondary | ICD-10-CM | POA: Diagnosis not present

## 2020-11-19 MED ORDER — BESIVANCE 0.6 % OP SUSP
1.0000 [drp] | Freq: Three times a day (TID) | OPHTHALMIC | 0 refills | Status: DC
  Filled 2020-11-19: qty 5, 25d supply, fill #0

## 2020-11-19 MED ORDER — NEOMYCIN-POLYMYXIN-DEXAMETH 3.5-10000-0.1 OP SUSP
1.0000 [drp] | Freq: Four times a day (QID) | OPHTHALMIC | 0 refills | Status: DC
  Filled 2020-11-19: qty 5, 25d supply, fill #0

## 2020-11-20 DIAGNOSIS — H16142 Punctate keratitis, left eye: Secondary | ICD-10-CM | POA: Diagnosis not present

## 2020-11-23 DIAGNOSIS — H18232 Secondary corneal edema, left eye: Secondary | ICD-10-CM | POA: Diagnosis not present

## 2020-11-23 DIAGNOSIS — H52222 Regular astigmatism, left eye: Secondary | ICD-10-CM | POA: Diagnosis not present

## 2020-11-23 DIAGNOSIS — H5213 Myopia, bilateral: Secondary | ICD-10-CM | POA: Diagnosis not present

## 2020-11-23 DIAGNOSIS — H1820 Unspecified corneal edema: Secondary | ICD-10-CM | POA: Diagnosis not present

## 2020-11-30 ENCOUNTER — Other Ambulatory Visit (HOSPITAL_BASED_OUTPATIENT_CLINIC_OR_DEPARTMENT_OTHER): Payer: Self-pay

## 2021-01-16 ENCOUNTER — Other Ambulatory Visit (HOSPITAL_BASED_OUTPATIENT_CLINIC_OR_DEPARTMENT_OTHER): Payer: Self-pay

## 2021-03-14 ENCOUNTER — Other Ambulatory Visit: Payer: Self-pay

## 2021-03-14 ENCOUNTER — Encounter (HOSPITAL_COMMUNITY): Payer: Self-pay

## 2021-03-14 ENCOUNTER — Ambulatory Visit (HOSPITAL_COMMUNITY)
Admission: EM | Admit: 2021-03-14 | Discharge: 2021-03-14 | Disposition: A | Discharge status: Home / Self Care | Payer: 59 | Attending: Physician Assistant | Admitting: Physician Assistant

## 2021-03-14 DIAGNOSIS — R42 Dizziness and giddiness: Secondary | ICD-10-CM | POA: Insufficient documentation

## 2021-03-14 LAB — POCT URINALYSIS DIPSTICK, ED / UC
Bilirubin Urine: NEGATIVE
Glucose, UA: NEGATIVE mg/dL
Hgb urine dipstick: NEGATIVE
Ketones, ur: NEGATIVE mg/dL
Leukocytes,Ua: NEGATIVE
Nitrite: NEGATIVE
Protein, ur: NEGATIVE mg/dL
Specific Gravity, Urine: 1.015 (ref 1.005–1.030)
Urobilinogen, UA: 0.2 mg/dL (ref 0.0–1.0)
pH: 5.5 (ref 5.0–8.0)

## 2021-03-14 LAB — COMPREHENSIVE METABOLIC PANEL
ALT: 66 U/L — ABNORMAL HIGH (ref 0–44)
AST: 44 U/L — ABNORMAL HIGH (ref 15–41)
Albumin: 4.6 g/dL (ref 3.5–5.0)
Alkaline Phosphatase: 57 U/L (ref 38–126)
Anion gap: 11 (ref 5–15)
BUN: 14 mg/dL (ref 6–20)
CO2: 25 mmol/L (ref 22–32)
Calcium: 10.1 mg/dL (ref 8.9–10.3)
Chloride: 99 mmol/L (ref 98–111)
Creatinine, Ser: 1.21 mg/dL (ref 0.61–1.24)
GFR, Estimated: >60 mL/min (ref >60)
Glucose, Bld: 151 mg/dL — ABNORMAL HIGH (ref 70–99)
Potassium: 4 mmol/L (ref 3.5–5.1)
Sodium: 135 mmol/L (ref 135–145)
Total Bilirubin: 0.9 mg/dL (ref 0.3–1.2)
Total Protein: 7.8 g/dL (ref 6.5–8.1)

## 2021-03-14 LAB — CBC WITH DIFFERENTIAL/PLATELET
Abs Immature Granulocytes: 0.03 10*3/uL (ref 0.00–0.07)
Basophils Absolute: 0.1 10*3/uL (ref 0.0–0.1)
Basophils Relative: 1 %
Eosinophils Absolute: 0.1 10*3/uL (ref 0.0–0.5)
Eosinophils Relative: 1 %
HCT: 51 % (ref 39.0–52.0)
Hemoglobin: 18 g/dL — ABNORMAL HIGH (ref 13.0–17.0)
Immature Granulocytes: 0 %
Lymphocytes Relative: 37 %
Lymphs Abs: 2.8 10*3/uL (ref 0.7–4.0)
MCH: 30.6 pg (ref 26.0–34.0)
MCHC: 35.3 g/dL (ref 30.0–36.0)
MCV: 86.7 fL (ref 80.0–100.0)
Monocytes Absolute: 0.6 10*3/uL (ref 0.1–1.0)
Monocytes Relative: 7 %
Neutro Abs: 4.2 10*3/uL (ref 1.7–7.7)
Neutrophils Relative %: 54 %
Platelets: 268 10*3/uL (ref 150–400)
RBC: 5.88 MIL/uL — ABNORMAL HIGH (ref 4.22–5.81)
RDW: 12.8 % (ref 11.5–15.5)
WBC: 7.8 10*3/uL (ref 4.0–10.5)
nRBC: 0 % (ref 0.0–0.2)

## 2021-03-14 LAB — CBG MONITORING, ED: Glucose-Capillary: 149 mg/dL — ABNORMAL HIGH (ref 70–99)

## 2021-03-14 NOTE — Discharge Instructions (Signed)
Your work-up today was normal.  We will contact you if your lab work is abnormal.  Please monitor your MyChart for these results.  Please make sure you are drinking plenty of fluid and eating small frequent meals.  Try to rest as much as possible.  Follow-up with your primary care within the week for reevaluation.  If at any point you have worsening symptoms including episodes where you pass out you need to go to the emergency room as we discussed. ?

## 2021-03-14 NOTE — ED Triage Notes (Signed)
Pt reports having dizzy spells since this morning.  ? ?States the last few weeks he has felt dizzy and states it goes away and states he felt he was going to pass out. Denies headaches and states he has sinus pressure.  ?

## 2021-03-14 NOTE — ED Provider Notes (Signed)
[facility]    CSN: [id number] Arrival date & time: 03/14/21  0945      History   Chief Complaint Chief Complaint  Patient presents with   Dizziness    HPI Kyle [name] is a 41 y.o. male.   Patient presents today with a several week history of intermittent dizzy spells.  Reports he had a dizzy spell earlier today where he felt as though he was going to pass out but denies any syncopal episode prompting evaluation today.  Episodes typically last for 30 seconds to a few minutes but he has not identified any trigger.  He denies any recent medication changes outside of decreasing his metformin dose due to associated GI side effects.  He denies any head injury.  Denies any recent illness including cough, congestion, fever, nausea, vomiting.  He is eating and drinking normally and denies any recent dietary changes.  He denies history of arrhythmia.   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 03/02/2018   Class 2 obesity due to excess calories without serious comorbidity with body mass index (BMI) of 39.0 to 39.9 in adult 03/04/2017   HTN, goal below 140/90 11/19/2015    Class: Chronic   Pilonidal disease s/p excision 12/04/2017 11/19/2015   Blood glucose elevated 11/19/2015    Past Surgical History:  Procedure Laterality Date   ABCESS DRAINAGE     Pilonidal cyst   LUMBAR LAMINECTOMY/DECOMPRESSION MICRODISCECTOMY Left 04/08/2018   Procedure: Left L5-S1 discectomy;  Surgeon: Venita LickBrooks, Dahari, MD;  [facility];  Service: Orthopedics;  Laterality: Left;  120 mins   PILONIDAL CYST EXCISION N/A 12/04/2017   Procedure: EXCISION OF  PILONIDAL DISEASE;  Surgeon: Karie SodaGross, Steven, MD;  [facility];  Service: General;  Laterality: N/A;   WISDOM TOOTH EXTRACTION         Home Medications    Prior to Admission medications   Medication Sig Start Date End Date Taking? Authorizing Provider  Besifloxacin HCl (BESIVANCE) 0.6 % SUSP Place 1 drop into the left eye 3 (three) times daily. 11/19/20     ciprofloxacin (CILOXAN) 0.3 % ophthalmic solution Place 1 drop into the left eye every 4 (four) hours while awake. Administer 1 drop, every 2 hours, while awake, for 2 days. Then 1 drop, every 4 hours, while awake, for the next 5 days. 11/17/20   Particia NearingLane, Rachel Elizabeth, PA-C  doxycycline (VIBRAMYCIN) 100 MG capsule TAKE 1 CAPSULE BY MOUTH TWICE DAILY. 04/13/20 04/13/21  Rushie Chestnutovington, Sarah M, PA-C  gabapentin (NEURONTIN) 300 MG capsule TAKE 1 CAPSULE 3 TIMES A DAY BY MOUTH 12/20/19 12/19/20  Rhodia Albrightivera, Amanda N, PA-C  gabapentin (NEURONTIN) 300 MG capsule TAKE 1 CAPSULE BY MOUTH DAILY FOR 10 DAYS 11/25/19 11/24/20  Venita LickBrooks, Dahari, MD  lisinopril-hydrochlorothiazide (PRINZIDE,ZESTORETIC) 20-12.5 MG tablet Take 1 tablet by mouth daily. 04/05/18   Andrena Mewsigby, Michael D, DO  lisinopril-hydrochlorothiazide (ZESTORETIC) 20-12.5 MG tablet Take 1 tablet by mouth daily. 07/11/20     lisinopril-hydrochlorothiazide (ZESTORETIC) 20-12.5 MG tablet Take 1 tablet by mouth daily. 10/16/20     lisinopril-hydrochlorothiazide (ZESTORETIC) 20-12.5 MG tablet  Take 1 tablet by mouth daily. 03/29/20   [provider]  metFORMIN (GLUCOPHAGE) 1000 MG tablet Take 1 tablet by mouth twice a day 04/18/20     metFORMIN (GLUCOPHAGE) 1000 MG tablet Take 1 tablet (1,000 mg total) by mouth 2 (two) times daily. 04/18/20     metFORMIN (GLUCOPHAGE) 500 MG tablet Take 1 tablet (500 mg total) by mouth 2 (two) times daily with a meal. Start when discharged from Hospital. 04/08/18   Andrena Mews, DO  metFORMIN (GLUCOPHAGE) 500 MG tablet Take 500 mg by mouth 2 (two) times  daily. 04/12/20   [provider]  metFORMIN (GLUCOPHAGE) 500 MG tablet TAKE 1 TABLET BY MOUTH 2 TIMES DAILY 04/10/20 04/10/21  [name] D, DO  metFORMIN (GLUCOPHAGE) 500 MG tablet TAKE 1 TABLET BY MOUTH TWO TIMES DAILY 06/17/19 06/16/20  Andrena Mews, DO  neomycin-polymyxin b-dexamethasone (MAXITROL) 3.5-10000-0.1 SUSP Place 1 drop into the left eye 4 (four) times daily. 11/19/20     ondansetron (ZOFRAN) 4 MG tablet Take 1 tablet (4 mg total) by mouth every 8 (eight) hours as needed for nausea or vomiting. 04/08/18   Venita Lick, MD    Family History Family History  Problem Relation Age of Onset   Hypertension Father    Diabetes Father    Inflammatory bowel disease Sister     Social History Social History   Tobacco Use   Smoking status: Never   Smokeless tobacco: Never   Tobacco comments:    once monthly  Vaping Use   Vaping Use: Never used  Substance Use Topics   Alcohol use: Yes   Drug use: No     Allergies   Patient has no known allergies.   Review of Systems Review of Systems  Constitutional:  Positive for activity change. Negative for appetite change, fatigue and fever.  Respiratory:  Negative for cough and shortness of breath.   Cardiovascular:  Negative for chest pain.  Gastrointestinal:  Negative for abdominal pain, diarrhea, nausea and vomiting.  Neurological:  Positive for light-headedness. Negative for dizziness, facial asymmetry, speech difficulty, weakness and headaches.    Physical Exam Triage Vital Signs ED Triage Vitals  Enc Vitals Group     BP 03/14/21 1142 (!) 116/95     Pulse Rate 03/14/21 1141 97     Resp 03/14/21 1141 17     Temp 03/14/21 1142 98.2 F (36.8 C)     Temp Source 03/14/21 1142 Oral     SpO2 03/14/21 1141 100 %     Weight --      Height --      Head Circumference --      Peak Flow --      Pain Score 03/14/21 1142 0     Pain Loc --      Pain Edu? --      Excl. in GC? --    Orthostatic VS for the past 24  hrs:  BP- Lying Pulse- Lying BP- Sitting Pulse- Sitting BP- Standing at 0 minutes Pulse- Standing at 0 minutes  03/14/21 1300 114/80 88 113/81 89 106/82 97    Updated Vital Signs BP (!) 116/95 (BP Location: Left Arm)    Pulse 97    Temp 98.2 F (36.8 C) (Oral)    Resp 17    SpO2 100%   Visual Acuity Right Eye Distance:   Left Eye Distance:   Bilateral Distance:    Right Eye Near:   Left Eye Near:  Bilateral Near:     Physical Exam Vitals reviewed.  Constitutional:      General: He is awake.     Appearance: Normal appearance. He is well-developed. He is not ill-appearing.     Comments: Very pleasant male appears stated age in no acute distress sitting comfortably in exam room  HENT:     Head: Normocephalic and atraumatic.     Right Ear: Tympanic membrane, ear canal and external ear normal. Tympanic membrane is not erythematous or bulging.     Left Ear: Tympanic membrane, ear canal and external ear normal. Tympanic membrane is not erythematous or bulging.     Nose: Nose normal.     Mouth/Throat:     Tongue: Tongue does not deviate from midline.     Pharynx: Uvula midline. No oropharyngeal exudate, posterior oropharyngeal erythema or uvula swelling.  Eyes:     Extraocular Movements: Extraocular movements intact.     Conjunctiva/sclera: Conjunctivae normal.     Pupils: Pupils are equal, round, and reactive to light.  Cardiovascular:     Rate and Rhythm: Normal rate and regular rhythm.     Heart sounds: Normal heart sounds, S1 normal and S2 normal. No murmur heard. Pulmonary:     Effort: Pulmonary effort is normal. No accessory muscle usage or respiratory distress.     Breath sounds: Normal breath sounds. No stridor. No wheezing, rhonchi or rales.     Comments: Clear to auscultation bilaterally Musculoskeletal:     Cervical back: Normal range of motion and neck supple.     Comments: Strength 5/5 bilateral upper and lower extremities  Neurological:     General: No focal  deficit present.     Mental Status: He is alert.     Cranial Nerves: Cranial nerves 2-12 are intact.     Motor: Motor function is intact.     Coordination: Coordination is intact.     Gait: Gait is intact.  Psychiatric:        Behavior: Behavior is cooperative.     UC Treatments / Results  Labs (all labs ordered are listed, but only abnormal results are displayed) Labs Reviewed  CBG MONITORING, ED - Abnormal; Notable for the following components:      Result Value   Glucose-Capillary 149 (*)    All other components within normal limits  CBC WITH DIFFERENTIAL/PLATELET  COMPREHENSIVE METABOLIC PANEL  POCT URINALYSIS DIPSTICK, ED / UC    EKG   Radiology No results found.  Procedures Procedures (including critical care time)  Medications Ordered in UC Medications - No data to display  Initial Impression / Assessment and Plan / UC Course  I have reviewed the triage vital signs and the nursing notes.  Pertinent labs & imaging results that were available during my care of the patient were reviewed by me and considered in my medical decision making (see chart for details).     EKG obtained showed normal sinus rhythm with ventricular rate of 90 bpm without ischemic changes; compared to 12/04/2017 tracing nonspecific ST changes in aVF.  A glucose was appropriate.  Orthostatic vital signs were normal.  UA showed no evidence of dehydration.  Patient had no recurrent episodes during evaluation.  CBC and CMP were obtained to monitor for anemia and electrolyte imbalance-results pending.  Will contact patient with results if this changes treatment plan.  He was encouraged to rest and drink plenty of fluid.  Discussed that he should eat small frequent meals with a consistent amount of  carbohydrates as unregulated blood sugar could contribute to symptoms.  He is to follow-up with primary care within a week for reevaluation.  Discussed that if he has any worsening episodes including syncope  he needs to go to the emergency room.  Strict return precautions given to which he expressed understanding.  Work excuse note provided.  Final Clinical Impressions(s) / UC Diagnoses   Final diagnoses:  Episodic lightheadedness     Discharge Instructions      Your work-up today was normal.  We will contact you if your lab work is abnormal.  Please monitor your MyChart for these results.  Please make sure you are drinking plenty of fluid and eating small frequent meals.  Try to rest as much as possible.  Follow-up with your primary care within the week for reevaluation.  If at any point you have worsening symptoms including episodes where you pass out you need to go to the emergency room as we discussed.     ED Prescriptions   None    PDMP not reviewed this encounter.   Jeani Hawking, PA-C 03/14/21 1319

## 2021-04-05 ENCOUNTER — Other Ambulatory Visit (HOSPITAL_BASED_OUTPATIENT_CLINIC_OR_DEPARTMENT_OTHER): Payer: Self-pay

## 2021-04-05 MED ORDER — AMOXICILLIN 500 MG PO CAPS
ORAL_CAPSULE | ORAL | 0 refills | Status: DC
  Filled 2021-04-05: qty 30, 10d supply, fill #0

## 2021-04-30 ENCOUNTER — Other Ambulatory Visit (HOSPITAL_BASED_OUTPATIENT_CLINIC_OR_DEPARTMENT_OTHER): Payer: Self-pay

## 2021-05-15 ENCOUNTER — Other Ambulatory Visit: Payer: Self-pay

## 2021-05-31 ENCOUNTER — Other Ambulatory Visit (HOSPITAL_BASED_OUTPATIENT_CLINIC_OR_DEPARTMENT_OTHER): Payer: Self-pay

## 2021-05-31 MED ORDER — CLINDAMYCIN HCL 300 MG PO CAPS
ORAL_CAPSULE | ORAL | 0 refills | Status: DC
  Filled 2021-05-31: qty 20, 10d supply, fill #0

## 2021-06-18 ENCOUNTER — Other Ambulatory Visit (HOSPITAL_BASED_OUTPATIENT_CLINIC_OR_DEPARTMENT_OTHER): Payer: Self-pay

## 2021-06-18 MED ORDER — AMOXICILLIN-POT CLAVULANATE 875-125 MG PO TABS
ORAL_TABLET | ORAL | 1 refills | Status: DC
  Filled 2021-06-18: qty 20, 10d supply, fill #0

## 2021-06-24 ENCOUNTER — Other Ambulatory Visit (HOSPITAL_COMMUNITY): Payer: Self-pay | Admitting: Orthopedic Surgery

## 2021-07-11 ENCOUNTER — Ambulatory Visit (HOSPITAL_COMMUNITY)
Admission: EM | Admit: 2021-07-11 | Discharge: 2021-07-11 | Disposition: A | Discharge status: Home / Self Care | Payer: 59 | Attending: Internal Medicine | Admitting: Internal Medicine

## 2021-07-11 ENCOUNTER — Other Ambulatory Visit (HOSPITAL_COMMUNITY): Payer: Self-pay

## 2021-07-11 ENCOUNTER — Encounter (HOSPITAL_COMMUNITY): Payer: Self-pay | Admitting: *Deleted

## 2021-07-11 DIAGNOSIS — M5432 Sciatica, left side: Secondary | ICD-10-CM

## 2021-07-11 HISTORY — DX: Psoriasis, unspecified: L40.9

## 2021-07-11 MED ORDER — METHYLPREDNISOLONE SODIUM SUCC 125 MG IJ SOLR
80.0000 mg | Freq: Once | INTRAMUSCULAR | Status: AC
  Administered 2021-07-11: 80 mg via INTRAMUSCULAR

## 2021-07-11 MED ORDER — METHYLPREDNISOLONE SODIUM SUCC 125 MG IJ SOLR
INTRAMUSCULAR | Status: AC
  Filled 2021-07-11: qty 2

## 2021-07-11 MED ORDER — PREDNISONE 10 MG PO TABS
20.0000 mg | ORAL_TABLET | Freq: Every day | ORAL | 0 refills | Status: AC
  Filled 2021-07-11: qty 10, 5d supply, fill #0

## 2021-07-11 NOTE — Discharge Instructions (Addendum)
Take prednisone 20mg  once daily for the next 5 days starting tomorrow since you were given a shot of steroid in the clinic today.  The steroid may elevate your blood sugar slightly temporarily, keep an eye on this.  Call [facility] to schedule an appointment for follow-up.  If you develop any new or worsening symptoms or do not improve in the next 2 to 3 days, please return.  If your symptoms are severe, please go to the emergency room.  Follow-up with your primary care provider for further evaluation and management of your symptoms as well as ongoing wellness visits.  I hope you feel better!

## 2021-07-11 NOTE — ED Triage Notes (Signed)
Denies injury. Reports hx L5-S1 discectomy surgery 2021; states pain had been under control since then until approx 2 months ago when pain started to flare up again. Describes left low back pain radiating down into left little toe with intermittent parasthesias. Pt wearing back support brace.

## 2021-07-11 NOTE — ED Provider Notes (Signed)
[place]    CSN: [id number] Arrival date & time: 07/11/21  [id number]      History   Chief Complaint Chief Complaint  Patient presents with   Back Pain    HPI Kyle [name] is a 41 y.o. male.   Patient presents to urgent care for evaluation of lower back pain as well as sciatic nerve pain on the left side off and on for the last 2 months. Patient recently underwent root canal procedure and states that he thought the pain may have been related to this. After root canal procedure, pain to left sciatic joint worsened and he is now experiencing numbness/tingling radiating to his left toes. Currently rates pain at a 6 on a scale of 0-10 and describes it as an aching pain to his left buttock. History of drop foot with surgical correction in March 2020 with L5-S1 discectomy. Patient has been going to chiropractor for the last year with some improvement until recently when chiropractic made pain worse. Denies footdrop at this time. Denies bowel and bladder incontinence, nausea, vomiting, headache, abdominal pain, calf pain, and fever/chills. Currently wearing back brace to help with pain, but is unable to get into a position of comfort in exam room. He has been using hemp oil for his symptoms intermittently with some relief. He takes ibuprofen and tylenol consistently everyday without relief of pain. Took 400mg  ibuprofen this morning at 0630. Pain is worse with twisting movement at the hips. No other aggravating or relieving factors at this time.    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 03/02/2018   Class 2 obesity  due to excess calories without serious comorbidity with body mass index (BMI) of 39.0 to 39.9 in adult 03/04/2017   HTN, goal below 140/90 11/19/2015    Class: Chronic   Pilonidal disease s/p excision 12/04/2017 11/19/2015   Blood glucose elevated 11/19/2015    Past Surgical History:  Procedure Laterality Date   ABCESS DRAINAGE     Pilonidal cyst   LUMBAR LAMINECTOMY/DECOMPRESSION MICRODISCECTOMY Left 04/08/2018   Procedure: Left L5-S1 discectomy;  Surgeon: Melina Schools, MD;  Location: [place];  Service: Orthopedics;  Laterality: Left;  120 mins   PILONIDAL CYST EXCISION N/A 12/04/2017   Procedure: EXCISION OF PILONIDAL DISEASE;  Surgeon: Michael Boston, MD;  Location: [place] [facility];  Service: General;  Laterality: N/A;   [place] Medications    Prior to Admission medications   Medication Sig Start Date End Date Taking? Authorizing Provider  lisinopril-hydrochlorothiazide (ZESTORETIC) 20-12.5 MG tablet Take 1 tablet by mouth daily. 10/16/20  Yes   predniSONE (DELTASONE) 10 MG tablet Take 2 tablets (20 mg total) by mouth daily for 5 days. 07/11/21 07/16/21 Yes Avaneesh Pepitone, Stasia Cavalier, FNP  amoxicillin (AMOXIL) 500 MG capsule Take 1 capsule (500 mg total) by mouth every 8 hours for 10 days. 04/05/21     amoxicillin-clavulanate (AUGMENTIN) 875-125 MG tablet TAKE 1 TABLET TWICE DAILY UNTIL GONE. 06/18/21  Besifloxacin HCl (BESIVANCE) 0.6 % SUSP Place 1 drop into the left eye 3 (three) times daily. 11/19/20     ciprofloxacin (CILOXAN) 0.3 % ophthalmic solution Place 1 drop into the left eye every 4 (four) hours while awake. Administer 1 drop, every 2 hours, while awake, for 2 days. Then 1 drop, every 4 hours, while awake, for the next 5 days. 11/17/20   Volney American, PA-C  clindamycin (CLEOCIN) 300 MG capsule Take 1 capsule by mouth twice daily until finished. Must complete full course of treatment. 05/31/21     gabapentin (NEURONTIN) 300 MG capsule  TAKE 1 CAPSULE 3 TIMES A DAY BY MOUTH 12/20/19 12/19/20  Charlyne Petrin, PA-C  gabapentin (NEURONTIN) 300 MG capsule TAKE 1 CAPSULE BY MOUTH DAILY FOR 10 DAYS 11/25/19 11/24/20  Melina Schools, MD  lisinopril-hydrochlorothiazide (PRINZIDE,ZESTORETIC) 20-12.5 MG tablet Take 1 tablet by mouth daily. 04/05/18   Gerda Diss, DO  lisinopril-hydrochlorothiazide (ZESTORETIC) 20-12.5 MG tablet Take 1 tablet by mouth daily. 07/11/20     lisinopril-hydrochlorothiazide (ZESTORETIC) 20-12.5 MG tablet Take 1 tablet by mouth daily. 03/29/20   [provider]  metFORMIN (GLUCOPHAGE) 1000 MG tablet Take 1 tablet by mouth twice a day 04/18/20     metFORMIN (GLUCOPHAGE) 1000 MG tablet Take 1 tablet (1,000 mg total) by mouth 2 (two) times daily. 04/18/20     metFORMIN (GLUCOPHAGE) 500 MG tablet Take 1 tablet (500 mg total) by mouth 2 (two) times daily with a meal. Start when discharged from Hospital. 04/08/18   Gerda Diss, DO  metFORMIN (GLUCOPHAGE) 500 MG tablet Take 500 mg by mouth 2 (two) times daily. 04/12/20   [provider]  metFORMIN (GLUCOPHAGE) 500 MG tablet TAKE 1 TABLET BY MOUTH 2 TIMES DAILY 04/10/20 04/10/21  Teresa Coombs D, DO  metFORMIN (GLUCOPHAGE) 500 MG tablet TAKE 1 TABLET BY MOUTH TWO TIMES DAILY 06/17/19 06/16/20  Gerda Diss, DO  neomycin-polymyxin b-dexamethasone (MAXITROL) 3.5-10000-0.1 SUSP Place 1 drop into the left eye 4 (four) times daily. 11/19/20     ondansetron (ZOFRAN) 4 MG tablet Take 1 tablet (4 mg total) by mouth every 8 (eight) hours as needed for nausea or vomiting. 04/08/18   Melina Schools, MD    Family History Family History  Problem Relation Age of Onset   Hypertension Father    Diabetes Father    Inflammatory bowel disease Sister     Social History Social History   Tobacco Use   Smoking status: Never   Smokeless tobacco: Never   Tobacco comments:    once monthly  Vaping Use   Vaping Use: Never used  Substance Use Topics   Alcohol use: Not  Currently   Drug use: No     Allergies   Patient has no known allergies.   Review of Systems Review of Systems  Musculoskeletal:  Positive for back pain.  Per HPI   Physical Exam Triage Vital Signs ED Triage Vitals  Enc Vitals Group     BP 07/11/21 0921 129/88     Pulse Rate 07/11/21 0921 98     Resp 07/11/21 0921 16     Temp 07/11/21 0921 97.9 F (36.6 C)     Temp Source 07/11/21 0921 Oral     SpO2 07/11/21 0921 95 %     Weight --      Height --      Head Circumference --      Peak Flow --      Pain Score  07/11/21 0924 6     Pain Loc --      Pain Edu? --      Excl. in GC? --    No data found.  Updated Vital Signs BP 129/88   Pulse 98   Temp 97.9 F (36.6 C) (Oral)   Resp 16   SpO2 95%   Visual Acuity Right Eye Distance:   Left Eye Distance:   Bilateral Distance:    Right Eye Near:   Left Eye Near:    Bilateral Near:     Physical Exam Vitals and nursing note reviewed.  Constitutional:      Appearance: Normal appearance. He is not ill-appearing or toxic-appearing.     Comments: Very pleasant patient sitting on exam in position of comfort table in no acute distress.   HENT:     Head: Normocephalic and atraumatic.     Right Ear: Hearing and external ear normal.     Left Ear: Hearing and external ear normal.     Nose: Nose normal.     Mouth/Throat:     Lips: Pink.     Mouth: Mucous membranes are moist.  Eyes:     General: Lids are normal. Vision grossly intact. Gaze aligned appropriately.     Extraocular Movements: Extraocular movements intact.     Conjunctiva/sclera: Conjunctivae normal.  Pulmonary:     Effort: Pulmonary effort is normal.  Abdominal:     Palpations: Abdomen is soft.  Musculoskeletal:     Cervical back: Neck supple.     Lumbar back: No edema, spasms, tenderness or bony tenderness. Normal range of motion.     Comments: Minimal pain elicited to palpation of left sciatic nerve. Pain mostly to the posterior aspect of patient's  left thigh radiating to his left pinky toe. No deformity, ecchymosis, sign of trauma/injury, or decreased range of motion.   Skin:    General: Skin is warm and dry.     Capillary Refill: Capillary refill takes less than 2 seconds.     Findings: No rash.  Neurological:     General: No focal deficit present.     Mental Status: He is alert and oriented to person, place, and time. Mental status is at baseline.     Cranial Nerves: No dysarthria or facial asymmetry.     Gait: Gait is intact.  Psychiatric:        Mood and Affect: Mood normal.        Speech: Speech normal.        Behavior: Behavior normal.        Thought Content: Thought content normal.        Judgment: Judgment normal.      UC Treatments / Results  Labs (all labs ordered are listed, but only abnormal results are displayed) Labs Reviewed - No data to display  EKG   Radiology No results found.  Procedures Procedures (including critical care time)  Medications Ordered in UC Medications  methylPREDNISolone sodium succinate (SOLU-MEDROL) 125 mg/2 mL injection 80 mg (has no administration in time range)    Initial Impression / Assessment and Plan / UC Course  I have reviewed the triage vital signs and the nursing notes.  Pertinent labs & imaging results that were available during my care of the patient were reviewed by me and considered in my medical decision making (see chart for details).  Symptomology and physical exam are consistent with left-sided sciatica.  No red flag signs elicited with physical exam.  Patient has history of diabetes myelitis and takes metformin.  Patient given 80 mg Solu-Medrol injection in clinic to initiate steroid therapy.  He is to start taking 20 mg prednisone once daily for the next 5 days starting tomorrow.  Instructed patient not to take any NSAID containing medications while taking prednisone and he must take prednisone with food.  Given precautions regarding elevated blood sugar  while on steroid medications.  Advised patient to follow-up with PCP for management of diabetes as he has not had an A1c in approximately 6 months.  Walking referral to [facility] given and patient encouraged to schedule an appointment for follow-up in the next week or 2.   Discussed physical exam and available lab work findings in clinic with patient.  Counseled patient regarding appropriate use of medications and potential side effects for all medications recommended or prescribed today. Discussed red flag signs and symptoms of worsening condition,when to call the PCP office, return to urgent care, and when to seek higher level of care in the emergency department. Patient verbalizes understanding and agreement with plan. All questions answered. Patient discharged in stable condition.  Final Clinical Impressions(s) / UC Diagnoses   Final diagnoses:  Sciatica of left side     Discharge Instructions      Take prednisone 20mg  once daily for the next 5 days starting tomorrow since you were given a shot of steroid in the clinic today.  The steroid may elevate your blood sugar slightly temporarily, keep an eye on this.  Call [facility] to schedule an appointment for follow-up.  If you develop any new or worsening symptoms or do not improve in the next 2 to 3 days, please return.  If your symptoms are severe, please go to the emergency room.  Follow-up with your primary care provider for further evaluation and management of your symptoms as well as ongoing wellness visits.  I hope you feel better!     ED Prescriptions     Medication Sig Dispense Auth. Provider   predniSONE (DELTASONE) 10 MG tablet Take 2 tablets (20 mg total) by mouth daily for 5 days. 10 tablet , FNP      PDMP not reviewed this encounter.   Carlisle Beers, Carlisle Beers 07/11/21 1026

## 2021-07-23 ENCOUNTER — Other Ambulatory Visit (HOSPITAL_COMMUNITY): Payer: Self-pay

## 2021-07-25 ENCOUNTER — Other Ambulatory Visit (HOSPITAL_COMMUNITY): Payer: Self-pay

## 2021-08-02 ENCOUNTER — Other Ambulatory Visit: Payer: Self-pay

## 2021-08-02 ENCOUNTER — Other Ambulatory Visit (HOSPITAL_COMMUNITY): Payer: Self-pay

## 2021-08-02 MED ORDER — LISINOPRIL-HYDROCHLOROTHIAZIDE 20-12.5 MG PO TABS
1.0000 | ORAL_TABLET | Freq: Every day | ORAL | 1 refills | Status: DC
  Filled 2021-08-02 (×2): qty 30, 30d supply, fill #0
  Filled 2021-08-27: qty 90, 90d supply, fill #1
  Filled 2021-12-03: qty 60, 60d supply, fill #0
  Filled 2021-12-03: qty 30, 30d supply, fill #2

## 2021-08-05 ENCOUNTER — Other Ambulatory Visit (HOSPITAL_COMMUNITY): Payer: Self-pay

## 2021-08-19 ENCOUNTER — Other Ambulatory Visit (HOSPITAL_BASED_OUTPATIENT_CLINIC_OR_DEPARTMENT_OTHER): Payer: Self-pay

## 2021-08-19 MED ORDER — CIPROFLOXACIN HCL 0.3 % OP SOLN
OPHTHALMIC | 0 refills | Status: DC
  Filled 2021-08-19: qty 5, 31d supply, fill #0

## 2021-08-19 MED ORDER — NEOMYCIN-POLYMYXIN-DEXAMETH 3.5-10000-0.1 OP SUSP
OPHTHALMIC | 0 refills | Status: DC
  Filled 2021-08-19: qty 5, 25d supply, fill #0

## 2021-08-27 ENCOUNTER — Other Ambulatory Visit: Payer: Self-pay

## 2021-08-29 ENCOUNTER — Other Ambulatory Visit: Payer: Self-pay

## 2021-09-13 ENCOUNTER — Emergency Department (HOSPITAL_COMMUNITY)
Admission: EM | Admit: 2021-09-13 | Discharge: 2021-09-13 | Disposition: A | Discharge status: Home / Self Care | Payer: 59 | Attending: Emergency Medicine | Admitting: Emergency Medicine

## 2021-09-13 ENCOUNTER — Encounter (HOSPITAL_COMMUNITY): Payer: Self-pay

## 2021-09-13 ENCOUNTER — Other Ambulatory Visit: Payer: Self-pay

## 2021-09-13 ENCOUNTER — Emergency Department (HOSPITAL_COMMUNITY): Payer: 59

## 2021-09-13 DIAGNOSIS — R739 Hyperglycemia, unspecified: Secondary | ICD-10-CM | POA: Insufficient documentation

## 2021-09-13 DIAGNOSIS — N200 Calculus of kidney: Secondary | ICD-10-CM

## 2021-09-13 DIAGNOSIS — N132 Hydronephrosis with renal and ureteral calculous obstruction: Secondary | ICD-10-CM | POA: Diagnosis not present

## 2021-09-13 DIAGNOSIS — R1031 Right lower quadrant pain: Secondary | ICD-10-CM | POA: Diagnosis present

## 2021-09-13 DIAGNOSIS — R7989 Other specified abnormal findings of blood chemistry: Secondary | ICD-10-CM

## 2021-09-13 DIAGNOSIS — N201 Calculus of ureter: Secondary | ICD-10-CM

## 2021-09-13 DIAGNOSIS — R7401 Elevation of levels of liver transaminase levels: Secondary | ICD-10-CM | POA: Diagnosis not present

## 2021-09-13 LAB — COMPREHENSIVE METABOLIC PANEL
ALT: 86 U/L — ABNORMAL HIGH (ref 0–44)
AST: 71 U/L — ABNORMAL HIGH (ref 15–41)
Albumin: 4.4 g/dL (ref 3.5–5.0)
Alkaline Phosphatase: 48 U/L (ref 38–126)
Anion gap: 10 (ref 5–15)
BUN: 12 mg/dL (ref 6–20)
CO2: 28 mmol/L (ref 22–32)
Calcium: 10 mg/dL (ref 8.9–10.3)
Chloride: 100 mmol/L (ref 98–111)
Creatinine, Ser: 1.63 mg/dL — ABNORMAL HIGH (ref 0.61–1.24)
GFR, Estimated: 54 mL/min — ABNORMAL LOW (ref >60)
Glucose, Bld: 262 mg/dL — ABNORMAL HIGH (ref 70–99)
Potassium: 3.9 mmol/L (ref 3.5–5.1)
Sodium: 138 mmol/L (ref 135–145)
Total Bilirubin: 1.2 mg/dL (ref 0.3–1.2)
Total Protein: 7.4 g/dL (ref 6.5–8.1)

## 2021-09-13 LAB — URINALYSIS, ROUTINE W REFLEX MICROSCOPIC
Bilirubin Urine: NEGATIVE
Glucose, UA: 150 mg/dL — AB
Ketones, ur: 5 mg/dL — AB
Leukocytes,Ua: NEGATIVE
Nitrite: NEGATIVE
Protein, ur: NEGATIVE mg/dL
Specific Gravity, Urine: >1.046 — ABNORMAL HIGH (ref 1.005–1.030)
pH: 5 (ref 5.0–8.0)

## 2021-09-13 LAB — CBC WITH DIFFERENTIAL/PLATELET
Abs Immature Granulocytes: 0.02 10*3/uL (ref 0.00–0.07)
Basophils Absolute: 0.1 10*3/uL (ref 0.0–0.1)
Basophils Relative: 1 %
Eosinophils Absolute: 0 10*3/uL (ref 0.0–0.5)
Eosinophils Relative: 0 %
HCT: 48.6 % (ref 39.0–52.0)
Hemoglobin: 17 g/dL (ref 13.0–17.0)
Immature Granulocytes: 0 %
Lymphocytes Relative: 9 %
Lymphs Abs: 0.9 10*3/uL (ref 0.7–4.0)
MCH: 31.3 pg (ref 26.0–34.0)
MCHC: 35 g/dL (ref 30.0–36.0)
MCV: 89.5 fL (ref 80.0–100.0)
Monocytes Absolute: 0.4 10*3/uL (ref 0.1–1.0)
Monocytes Relative: 4 %
Neutro Abs: 8.6 10*3/uL — ABNORMAL HIGH (ref 1.7–7.7)
Neutrophils Relative %: 86 %
Platelets: 258 10*3/uL (ref 150–400)
RBC: 5.43 MIL/uL (ref 4.22–5.81)
RDW: 13 % (ref 11.5–15.5)
WBC: 9.9 10*3/uL (ref 4.0–10.5)
nRBC: 0 % (ref 0.0–0.2)

## 2021-09-13 LAB — LIPASE, BLOOD: Lipase: 31 U/L (ref 11–51)

## 2021-09-13 MED ORDER — ONDANSETRON HCL 4 MG PO TABS: 4.0000 mg | ORAL_TABLET | Freq: Three times a day (TID) | ORAL | 0 refills | Status: DC | PRN

## 2021-09-13 MED ORDER — OXYCODONE-ACETAMINOPHEN 5-325 MG PO TABS
1.0000 | ORAL_TABLET | Freq: Four times a day (QID) | ORAL | 0 refills | Status: DC | PRN
  Filled 2021-09-13: qty 9, 3d supply, fill #0

## 2021-09-13 MED ORDER — SODIUM CHLORIDE 0.9 % IV BOLUS
1000.0000 mL | Freq: Once | INTRAVENOUS | Status: AC
  Administered 2021-09-13: 1000 mL via INTRAVENOUS

## 2021-09-13 MED ORDER — ONDANSETRON HCL 4 MG PO TABS
4.0000 mg | ORAL_TABLET | Freq: Three times a day (TID) | ORAL | 0 refills | Status: DC | PRN
  Filled 2021-09-13: qty 12, 4d supply, fill #0

## 2021-09-13 MED ORDER — IOHEXOL 300 MG/ML  SOLN
100.0000 mL | Freq: Once | INTRAMUSCULAR | Status: AC | PRN
  Administered 2021-09-13: 100 mL via INTRAVENOUS

## 2021-09-13 MED ORDER — MORPHINE SULFATE (PF) 4 MG/ML IV SOLN
4.0000 mg | Freq: Once | INTRAVENOUS | Status: AC
  Administered 2021-09-13: 4 mg via INTRAVENOUS
  Filled 2021-09-13: qty 1

## 2021-09-13 MED ORDER — ONDANSETRON HCL 4 MG/2ML IJ SOLN
4.0000 mg | Freq: Once | INTRAMUSCULAR | Status: AC
  Administered 2021-09-13: 4 mg via INTRAVENOUS
  Filled 2021-09-13: qty 2

## 2021-09-13 MED ORDER — TAMSULOSIN HCL 0.4 MG PO CAPS
0.4000 mg | ORAL_CAPSULE | Freq: Every day | ORAL | 0 refills | Status: DC
  Filled 2021-09-13: qty 30, 30d supply, fill #0

## 2021-09-13 MED ORDER — HYDROMORPHONE HCL 1 MG/ML IJ SOLN
0.5000 mg | Freq: Once | INTRAMUSCULAR | Status: AC
  Administered 2021-09-13: 0.5 mg via INTRAVENOUS
  Filled 2021-09-13: qty 1

## 2021-09-13 NOTE — ED Provider Notes (Signed)
[facility] EMERGENCY DEPARTMENT Provider Note   CSN: [id number] Arrival date & time: 09/13/21  0849     History  Chief Complaint  Patient presents with   Flank Pain    TEJ MURDAUGH is a 41 y.o. male who presents to the emergency department with concerns for right lower quadrant abdominal pain onset 11 PM last night.  Patient still has his gallbladder and appendix.  Has associated nausea, vomiting, hematuria.  No meds tried prior to arrival.  Denies dysuria.  No recent injury, trauma, fall.  No past medical history of kidney stones or UTI.  The history is provided by the patient and the spouse. No language interpreter was used.       Home Medications Prior to Admission medications   Medication Sig Start Date End Date Taking? Authorizing Provider  oxyCODONE-acetaminophen (PERCOCET/ROXICET) 5-325 MG tablet Take 1 tablet by mouth every 6 (six) hours as needed for severe pain. 09/13/21  Yes Elai Vanwyk A, PA-C  tamsulosin (FLOMAX) 0.4 MG CAPS capsule Take 1 capsule (0.4 mg total) by mouth daily. 09/13/21  Yes Umi Mainor A, PA-C  amoxicillin (AMOXIL) 500 MG capsule Take 1 capsule (500 mg total) by mouth every 8 hours for 10 days. 04/05/21     amoxicillin-clavulanate (AUGMENTIN) 875-125 MG tablet TAKE 1 TABLET TWICE DAILY UNTIL GONE. 06/18/21     Besifloxacin HCl (BESIVANCE) 0.6 % SUSP Place 1 drop into the left eye 3 (three) times daily. 11/19/20     ciprofloxacin (CILOXAN) 0.3 % ophthalmic solution Place 1 drop into the left eye every 4 (four) hours while awake. Administer 1 drop, every 2 hours, while awake, for 2 days. Then 1 drop, every 4 hours, while awake, for the next 5 days. 11/17/20   Particia Nearing, PA-C  ciprofloxacin (CILOXAN) 0.3 % ophthalmic solution Instill 1 drop into right eye three times a day 08/19/21     clindamycin (CLEOCIN) 300 MG capsule Take 1 capsule by mouth twice daily until finished. Must complete full course of treatment. 05/31/21      gabapentin (NEURONTIN) 300 MG capsule TAKE 1 CAPSULE 3 TIMES A DAY BY MOUTH 12/20/19 12/19/20  Rhodia Albright, PA-C  gabapentin (NEURONTIN) 300 MG capsule TAKE 1 CAPSULE BY MOUTH DAILY FOR 10 DAYS 11/25/19 11/24/20  Venita Lick, MD  lisinopril-hydrochlorothiazide (PRINZIDE,ZESTORETIC) 20-12.5 MG tablet Take 1 tablet by mouth daily. 04/05/18   Andrena Mews, DO  lisinopril-hydrochlorothiazide (ZESTORETIC) 20-12.5 MG tablet Take 1 tablet by mouth daily. 07/11/20     lisinopril-hydrochlorothiazide (ZESTORETIC) 20-12.5 MG tablet Take 1 tablet by mouth daily. 08/02/21     lisinopril-hydrochlorothiazide (ZESTORETIC) 20-12.5 MG tablet Take 1 tablet by mouth daily. 03/29/20   [provider]  metFORMIN (GLUCOPHAGE) 1000 MG tablet Take 1 tablet by mouth twice a day 04/18/20     metFORMIN (GLUCOPHAGE) 1000 MG tablet Take 1 tablet (1,000 mg total) by mouth 2 (two) times daily. 04/18/20     metFORMIN (GLUCOPHAGE) 500 MG tablet Take 1 tablet (500 mg total) by mouth 2 (two) times daily with a meal. Start when discharged from Hospital. 04/08/18   Andrena Mews, DO  metFORMIN (GLUCOPHAGE) 500 MG tablet Take 500 mg by mouth 2 (two) times daily. 04/12/20   [provider]  metFORMIN (GLUCOPHAGE) 500 MG tablet TAKE 1 TABLET BY MOUTH 2 TIMES DAILY 04/10/20 04/10/21  [name] D, DO  metFORMIN (GLUCOPHAGE) 500 MG tablet TAKE 1 TABLET BY MOUTH TWO TIMES DAILY 06/17/19 06/16/20  Berline Chough,  Veverly Fells, DO  neomycin-polymyxin b-dexamethasone (MAXITROL) 3.5-10000-0.1 SUSP Place 1 drop into the left eye 4 (four) times daily. 11/19/20     neomycin-polymyxin b-dexamethasone (MAXITROL) 3.5-10000-0.1 SUSP Instill 1 drop into right eye four times a day as directed 08/19/21     ondansetron (ZOFRAN) 4 MG tablet Take 1 tablet (4 mg total) by mouth every 8 (eight) hours as needed for nausea or vomiting. 09/13/21   Debie Ashline A, PA-C      Allergies    Patient has no known allergies.    Review of Systems   Review of  Systems  Constitutional:  Negative for fever.  Gastrointestinal:  Positive for abdominal pain, nausea and vomiting.  Genitourinary:  Positive for flank pain and hematuria. Negative for dysuria.  All other systems reviewed and are negative.   Physical Exam Updated Vital Signs BP 128/77 (BP Location: Right Arm)   Pulse 73   Temp 98.9 F (37.2 C) (Oral)   Resp 16   Ht 6\' 3"  (1.905 m)   Wt 136.1 kg   SpO2 99%   BMI 37.50 kg/m  Physical Exam Vitals and nursing note reviewed.  Constitutional:      General: He is not in acute distress.    Appearance: He is not diaphoretic.  HENT:     Head: Normocephalic and atraumatic.     Mouth/Throat:     Pharynx: No oropharyngeal exudate.  Eyes:     General: No scleral icterus.    Conjunctiva/sclera: Conjunctivae normal.  Cardiovascular:     Rate and Rhythm: Normal rate and regular rhythm.     Pulses: Normal pulses.     Heart sounds: Normal heart sounds.  Pulmonary:     Effort: Pulmonary effort is normal. No respiratory distress.     Breath sounds: Normal breath sounds. No wheezing.  Abdominal:     General: Bowel sounds are normal.     Palpations: Abdomen is soft. There is no mass.     Tenderness: There is no abdominal tenderness. There is right CVA tenderness (mild). There is no left CVA tenderness, guarding or rebound.     Comments: Tenderness to palpation to lower abdominal region, more so to the right lower quadrant.  Negative Murphy sign.  Negative Rovsing sign, obturator sign, psoas sign.  Musculoskeletal:        General: Normal range of motion.     Cervical back: Normal range of motion and neck supple.  Skin:    General: Skin is warm and dry.  Neurological:     Mental Status: He is alert.  Psychiatric:        Behavior: Behavior normal.    ED Results / Procedures / Treatments   Labs (all labs ordered are listed, but only abnormal results are displayed) Labs Reviewed  CBC WITH DIFFERENTIAL/PLATELET - Abnormal; Notable for  the following components:      Result Value   Neutro Abs 8.6 (*)    All other components within normal limits  COMPREHENSIVE METABOLIC PANEL - Abnormal; Notable for the following components:   Glucose, Bld 262 (*)    Creatinine, Ser 1.63 (*)    AST 71 (*)    ALT 86 (*)    GFR, Estimated 54 (*)    All other components within normal limits  URINALYSIS, ROUTINE W REFLEX MICROSCOPIC - Abnormal; Notable for the following components:   Specific Gravity, Urine >1.046 (*)    Glucose, UA 150 (*)    Hgb urine dipstick MODERATE (*)  Ketones, ur 5 (*)    Bacteria, UA RARE (*)    All other components within normal limits  LIPASE, BLOOD    EKG None  Radiology CT ABDOMEN PELVIS W CONTRAST  Result Date: 09/13/2021 CLINICAL DATA:  Right lower quadrant abdominal pain. EXAM: CT ABDOMEN AND PELVIS WITH CONTRAST TECHNIQUE: Multidetector CT imaging of the abdomen and pelvis was performed using the standard protocol following bolus administration of intravenous contrast. RADIATION DOSE REDUCTION: This exam was performed according to the departmental dose-optimization program which includes automated exposure control, adjustment of the mA and/or kV according to patient size and/or use of iterative reconstruction technique. CONTRAST:  OMNIPAQUE IOHEXOL 300 MG/ML  SOLN COMPARISON:  None Available. FINDINGS: Lower chest: No acute abnormality. Hepatobiliary: Diffuse hepatic steatosis. No suspicious hepatic lesion. Gallbladder is unremarkable. No biliary ductal dilation. Pancreas: No pancreatic ductal dilation or evidence of acute inflammation. Spleen: No splenomegaly or focal splenic lesion. Adrenals/Urinary Tract: Bilateral adrenal glands appear normal. Hypoenhancement of the right kidney with perinephric/periureteric stranding and mild hydroureteronephrosis to a 3 mm stone in the distal ureter near the ureterovesicular junction. Additional bilateral nonobstructive renal stones measure up to 6 mm. Urinary  bladder is unremarkable for degree of distension. Stomach/Bowel: No radiopaque enteric contrast material was administered. Stomach is unremarkable for degree of distension. No pathologic dilation of small or large bowel. The appendix and terminal ileum appear normal. No evidence of acute bowel inflammation. Sigmoid colonic diverticulosis without findings of acute diverticulitis. Vascular/Lymphatic: Normal caliber abdominal aorta. No pathologically enlarged abdominal or pelvic lymph nodes. Reproductive: Prostate is unremarkable. Other: No significant abdominopelvic free fluid. Musculoskeletal: No acute osseous abnormality. IMPRESSION: 1. Right-sided obstructive uropathy to the level of a 3 mm stone in the distal ureter near the UVJ, suggest correlation with laboratory values to exclude superimposed infection. 2. Additional nonobstructive bilateral renal stones measure up to 6 mm. 3. Sigmoid colonic diverticulosis without findings of acute diverticulitis. Electronically Signed   By: Maudry Mayhew M.D.   On: 09/13/2021 11:44    Procedures Procedures    Medications Ordered in ED Medications  morphine (PF) 4 MG/ML injection 4 mg (4 mg Intravenous Given 09/13/21 0959)  ondansetron (ZOFRAN) injection 4 mg (4 mg Intravenous Given 09/13/21 0956)  sodium chloride 0.9 % bolus 1,000 mL (0 mLs Intravenous Stopped 09/13/21 1208)  iohexol (OMNIPAQUE) 300 MG/ML solution 100 mL (100 mLs Intravenous Contrast Given 09/13/21 1136)  HYDROmorphone (DILAUDID) injection 0.5 mg (0.5 mg Intravenous Given 09/13/21 1330)    ED Course/ Medical Decision Making/ A&P Clinical Course as of 09/13/21 1411  Fri Sep 13, 2021  1058 Pt re-evaluated and noted improvement of symptoms with treatment regimen. [SB]  1110 Discussed labs with patient at bedside. [SB]  1312 Discussed with patient and wife regarding CT imaging findings and discharge treatment plan.  Answered all available questions.  Patient appears safe for discharge at this time.  [SB]  1359 Pt notes that his pain improved with treatment regimen. Pt appears safe for discharge. [SB]    Clinical Course User Index [SB] Demarion Pondexter A, PA-C                           Medical Decision Making Amount and/or Complexity of Data Reviewed Labs: ordered. Radiology: ordered.  Risk Prescription drug management.   Patient presents to the emergency department with RLQ and right flank pain onset 11 PM last night.  Patient still has his gallbladder and appendix.  Has  associated nausea, vomiting, hematuria.  No meds tried prior to arrival.  Patient afebrile.  On exam patient with Tenderness to palpation to lower abdominal region, more so to the right lower quadrant.  Negative Murphy sign.  Negative Rovsing sign, obturator sign, psoas sign.  No acute cardiovascular respiratory exam findings.  Differential diagnosis includes pancreatitis, cholecystitis, appendicitis, acute cystitis, nephrolithiasis, pyelonephritis.  Additional history obtained:  Additional history obtained from Spouse/Significant Other  Labs:  I ordered, and personally interpreted labs.  The pertinent results include:  Lipase at 31 unremarkable CBC without leukocytosis CMP with elevated glucose of 262, creatinine elevated 1.63, GFR decreased to 54 Urinalysis notable moderate for your hematuria.  Imaging: I ordered imaging studies including CT abdomen pelvis with I independently visualized and interpreted imaging which showed  1. Right-sided obstructive uropathy to the level of a 3 mm stone in  the distal ureter near the UVJ, suggest correlation with laboratory  values to exclude superimposed infection.  2. Additional nonobstructive bilateral renal stones measure up to 6  mm.  3. Sigmoid colonic diverticulosis without findings of acute  diverticulitis.   I agree with the radiologist interpretation  Medications:  I ordered medication including IVF, Zofran, Dilaudid, and Morphine for pain  management. Reevaluation of the patient after these medicines and interventions, I reevaluated the patient and found that they have improved I have reviewed the patients home medicines and have made adjustments as needed   Disposition: Presentation suspicious for ureterolithiasis and kidney stones.  Also suspicious for AKI.  Doubt appendicitis, cholecystitis, pancreatitis, pyelonephritis, or acute cystitis at this time. After consideration of the diagnostic results and the patients response to treatment, I feel that the patient would benefit from Discharge home.  Patient will be discharged home with a prescription for Flomax, Percocet, Zofran.  Also provided with information for urology for follow-up.  Supportive care measures and strict return precautions discussed with patient at bedside. Pt acknowledges and verbalizes understanding. Pt appears safe for discharge. Follow up as indicated in discharge paperwork.   This chart was dictated using voice recognition software, Dragon. Despite the best efforts of this provider to proofread and correct errors, errors may still occur which can change documentation meaning.  Final Clinical Impression(s) / ED Diagnoses Final diagnoses:  Elevated LFTs  Kidney stone  Ureterolithiasis    Rx / DC Orders ED Discharge Orders          Ordered    tamsulosin (FLOMAX) 0.4 MG CAPS capsule  Daily        09/13/21 1342    oxyCODONE-acetaminophen (PERCOCET/ROXICET) 5-325 MG tablet  Every 6 hours PRN        09/13/21 1342    ondansetron (ZOFRAN) 4 MG tablet  Every 8 hours PRN        09/13/21 1342              Yarelis Ambrosino A, PA-C 09/13/21 1525    Sloan Leiter, DO 09/17/21 [phone]

## 2021-09-13 NOTE — ED Notes (Signed)
Pt reports pain being improved. Awaiting CT scan. Pt wife at bedside. Pt denies any needs.

## 2021-09-13 NOTE — ED Triage Notes (Signed)
Pt c/o right sided back pain that started yesterday with difficulty urinating. Pt reports pain in right flank pain upon arrival. Pt NVD .

## 2021-09-13 NOTE — Discharge Instructions (Addendum)
It was a pleasure taking care of you today!   Your CT scan showed a stone in your ureter as well as stones to your kidneys. Attached is information for the urologist, call today and set up a follow up appointment. You will be sent a short course of percocet, take as prescribed. You will also be sent a prescription for flomax and zofran, take as directed. Follow up with your primary care provider as needed.  Return to the emergency department for experiencing increasing/worsening pain, inability to keep fluids down, fever, worsening symptoms.

## 2021-09-20 ENCOUNTER — Other Ambulatory Visit (HOSPITAL_COMMUNITY): Payer: Self-pay

## 2021-09-20 MED ORDER — TAMSULOSIN HCL 0.4 MG PO CAPS
0.4000 mg | ORAL_CAPSULE | Freq: Every day | ORAL | 3 refills | Status: DC
  Filled 2021-09-20: qty 90, 90d supply, fill #0

## 2021-12-03 ENCOUNTER — Encounter (HOSPITAL_BASED_OUTPATIENT_CLINIC_OR_DEPARTMENT_OTHER): Payer: Self-pay | Admitting: Pharmacist

## 2021-12-03 ENCOUNTER — Other Ambulatory Visit: Payer: Self-pay

## 2021-12-03 ENCOUNTER — Other Ambulatory Visit (HOSPITAL_COMMUNITY): Payer: Self-pay

## 2021-12-03 ENCOUNTER — Other Ambulatory Visit (HOSPITAL_BASED_OUTPATIENT_CLINIC_OR_DEPARTMENT_OTHER): Payer: Self-pay

## 2021-12-03 MED ORDER — FLUOROMETHOLONE 0.1 % OP SUSP
OPHTHALMIC | 0 refills | Status: DC
  Filled 2021-12-03: qty 5, 10d supply, fill #0

## 2021-12-03 MED ORDER — DOXYCYCLINE HYCLATE 50 MG PO CAPS
50.0000 mg | ORAL_CAPSULE | Freq: Two times a day (BID) | ORAL | 0 refills | Status: DC
  Filled 2021-12-03: qty 24, 12d supply, fill #0

## 2021-12-04 ENCOUNTER — Other Ambulatory Visit: Payer: Self-pay

## 2021-12-10 ENCOUNTER — Other Ambulatory Visit (HOSPITAL_COMMUNITY): Payer: Self-pay

## 2021-12-10 ENCOUNTER — Other Ambulatory Visit: Payer: Self-pay

## 2021-12-10 MED ORDER — METFORMIN HCL 500 MG PO TABS
500.0000 mg | ORAL_TABLET | Freq: Two times a day (BID) | ORAL | 0 refills | Status: DC
  Filled 2021-12-10 (×2): qty 60, 30d supply, fill #0

## 2021-12-10 MED ORDER — LISINOPRIL-HYDROCHLOROTHIAZIDE 20-12.5 MG PO TABS
1.0000 | ORAL_TABLET | Freq: Every day | ORAL | 0 refills | Status: DC
Start: 2021-12-10 — End: 2022-05-13

## 2021-12-11 ENCOUNTER — Other Ambulatory Visit (HOSPITAL_COMMUNITY): Payer: Self-pay

## 2021-12-23 ENCOUNTER — Other Ambulatory Visit (HOSPITAL_COMMUNITY): Payer: Self-pay

## 2021-12-23 MED ORDER — FARXIGA 10 MG PO TABS
10.0000 mg | ORAL_TABLET | Freq: Every day | ORAL | 1 refills | Status: DC
  Filled 2021-12-23 – 2021-12-24 (×2): qty 30, 30d supply, fill #0
  Filled 2022-01-21 – 2022-01-24 (×2): qty 30, 30d supply, fill #1

## 2021-12-23 MED ORDER — ATORVASTATIN CALCIUM 10 MG PO TABS
10.0000 mg | ORAL_TABLET | Freq: Every day | ORAL | 1 refills | Status: DC
  Filled 2021-12-23 – 2021-12-24 (×2): qty 30, 30d supply, fill #0
  Filled 2022-01-21: qty 30, 30d supply, fill #1

## 2021-12-23 MED ORDER — LISINOPRIL 20 MG PO TABS
20.0000 mg | ORAL_TABLET | Freq: Every day | ORAL | 1 refills | Status: DC
  Filled 2021-12-23 – 2021-12-24 (×2): qty 30, 30d supply, fill #0

## 2021-12-24 ENCOUNTER — Other Ambulatory Visit: Payer: Self-pay

## 2021-12-24 ENCOUNTER — Other Ambulatory Visit (HOSPITAL_COMMUNITY): Payer: Self-pay

## 2022-01-21 ENCOUNTER — Other Ambulatory Visit (HOSPITAL_BASED_OUTPATIENT_CLINIC_OR_DEPARTMENT_OTHER): Payer: Self-pay

## 2022-01-21 MED ORDER — LISINOPRIL 10 MG PO TABS
10.0000 mg | ORAL_TABLET | Freq: Every morning | ORAL | 1 refills | Status: DC
  Filled 2022-01-21: qty 30, 30d supply, fill #0
  Filled 2022-02-17: qty 30, 30d supply, fill #1
  Filled 2022-03-19 – 2022-03-21 (×2): qty 30, 30d supply, fill #2
  Filled 2022-04-21 (×2): qty 30, 30d supply, fill #3
  Filled 2022-05-23 – 2022-05-24 (×2): qty 30, 30d supply, fill #0
  Filled 2022-06-26 (×2): qty 30, 30d supply, fill #1

## 2022-01-22 ENCOUNTER — Other Ambulatory Visit (HOSPITAL_BASED_OUTPATIENT_CLINIC_OR_DEPARTMENT_OTHER): Payer: Self-pay

## 2022-01-24 ENCOUNTER — Other Ambulatory Visit: Payer: Self-pay

## 2022-01-28 ENCOUNTER — Other Ambulatory Visit: Payer: Self-pay

## 2022-01-29 ENCOUNTER — Other Ambulatory Visit: Payer: Self-pay

## 2022-01-31 ENCOUNTER — Other Ambulatory Visit: Payer: Self-pay

## 2022-02-03 ENCOUNTER — Other Ambulatory Visit (HOSPITAL_BASED_OUTPATIENT_CLINIC_OR_DEPARTMENT_OTHER): Payer: Self-pay

## 2022-02-03 ENCOUNTER — Encounter (HOSPITAL_BASED_OUTPATIENT_CLINIC_OR_DEPARTMENT_OTHER): Payer: Self-pay | Admitting: Pharmacist

## 2022-02-03 MED ORDER — JARDIANCE 25 MG PO TABS
25.0000 mg | ORAL_TABLET | Freq: Every day | ORAL | 3 refills | Status: DC
  Filled 2022-02-03 – 2022-02-04 (×2): qty 30, 30d supply, fill #0
  Filled 2022-02-17 – 2022-02-28 (×3): qty 30, 30d supply, fill #1

## 2022-02-04 ENCOUNTER — Other Ambulatory Visit: Payer: Self-pay

## 2022-02-04 ENCOUNTER — Other Ambulatory Visit (HOSPITAL_BASED_OUTPATIENT_CLINIC_OR_DEPARTMENT_OTHER): Payer: Self-pay

## 2022-02-17 ENCOUNTER — Other Ambulatory Visit: Payer: Self-pay

## 2022-02-18 ENCOUNTER — Other Ambulatory Visit: Payer: Self-pay

## 2022-02-18 MED ORDER — ATORVASTATIN CALCIUM 10 MG PO TABS
10.0000 mg | ORAL_TABLET | Freq: Every day | ORAL | 1 refills | Status: DC
Start: 2022-02-18 — End: 2022-04-21
  Filled 2022-02-18: qty 30, 30d supply, fill #0
  Filled 2022-03-19 – 2022-03-21 (×2): qty 30, 30d supply, fill #1

## 2022-02-21 ENCOUNTER — Other Ambulatory Visit: Payer: Self-pay

## 2022-02-24 ENCOUNTER — Other Ambulatory Visit: Payer: Self-pay

## 2022-02-25 ENCOUNTER — Other Ambulatory Visit: Payer: Self-pay

## 2022-02-27 ENCOUNTER — Other Ambulatory Visit (HOSPITAL_COMMUNITY): Payer: Self-pay

## 2022-02-28 ENCOUNTER — Other Ambulatory Visit: Payer: Self-pay

## 2022-03-03 ENCOUNTER — Other Ambulatory Visit: Payer: Self-pay

## 2022-03-04 ENCOUNTER — Other Ambulatory Visit: Payer: Self-pay

## 2022-03-07 ENCOUNTER — Other Ambulatory Visit: Payer: Self-pay

## 2022-03-12 ENCOUNTER — Other Ambulatory Visit: Payer: Self-pay

## 2022-03-14 ENCOUNTER — Other Ambulatory Visit: Payer: Self-pay

## 2022-03-21 ENCOUNTER — Other Ambulatory Visit: Payer: Self-pay

## 2022-04-20 ENCOUNTER — Other Ambulatory Visit (HOSPITAL_BASED_OUTPATIENT_CLINIC_OR_DEPARTMENT_OTHER): Payer: Self-pay

## 2022-04-21 ENCOUNTER — Other Ambulatory Visit: Payer: Self-pay

## 2022-04-21 MED ORDER — ATORVASTATIN CALCIUM 10 MG PO TABS
10.0000 mg | ORAL_TABLET | Freq: Every day | ORAL | 1 refills | Status: DC
  Filled 2022-04-21: qty 30, 30d supply, fill #0
  Filled 2022-05-23 – 2022-05-24 (×2): qty 30, 30d supply, fill #1

## 2022-05-13 ENCOUNTER — Encounter: Payer: Self-pay | Admitting: Family Medicine

## 2022-05-13 ENCOUNTER — Ambulatory Visit (INDEPENDENT_AMBULATORY_CARE_PROVIDER_SITE_OTHER): Payer: 59 | Admitting: Family Medicine

## 2022-05-13 ENCOUNTER — Other Ambulatory Visit (HOSPITAL_COMMUNITY): Payer: Self-pay

## 2022-05-13 VITALS — BP 123/88 | HR 86 | Temp 97.5°F | Ht 75.0 in | Wt 308.0 lb

## 2022-05-13 DIAGNOSIS — E119 Type 2 diabetes mellitus without complications: Secondary | ICD-10-CM | POA: Insufficient documentation

## 2022-05-13 DIAGNOSIS — G4730 Sleep apnea, unspecified: Secondary | ICD-10-CM | POA: Insufficient documentation

## 2022-05-13 DIAGNOSIS — N200 Calculus of kidney: Secondary | ICD-10-CM

## 2022-05-13 DIAGNOSIS — E1159 Type 2 diabetes mellitus with other circulatory complications: Secondary | ICD-10-CM

## 2022-05-13 DIAGNOSIS — E785 Hyperlipidemia, unspecified: Secondary | ICD-10-CM

## 2022-05-13 DIAGNOSIS — E1169 Type 2 diabetes mellitus with other specified complication: Secondary | ICD-10-CM | POA: Diagnosis not present

## 2022-05-13 DIAGNOSIS — I152 Hypertension secondary to endocrine disorders: Secondary | ICD-10-CM

## 2022-05-13 DIAGNOSIS — M5136 Other intervertebral disc degeneration, lumbar region: Secondary | ICD-10-CM

## 2022-05-13 MED ORDER — MOUNJARO 5 MG/0.5ML ~~LOC~~ SOAJ
5.0000 mg | SUBCUTANEOUS | 3 refills | Status: DC
  Filled 2022-05-13: qty 2, 28d supply, fill #0
  Filled 2022-05-13: qty 6, 84d supply, fill #0
  Filled 2022-07-01: qty 2, 28d supply, fill #0
  Filled 2022-07-20 – 2022-07-24 (×4): qty 2, 28d supply, fill #1

## 2022-05-13 MED ORDER — MOUNJARO 2.5 MG/0.5ML ~~LOC~~ SOAJ
2.5000 mg | SUBCUTANEOUS | 0 refills | Status: DC
  Filled 2022-05-13 – 2022-06-11 (×3): qty 2, 28d supply, fill #0

## 2022-05-13 NOTE — Progress Notes (Signed)
Kyle [name] is a 42 y.o. male who presents today for an office visit.  He is a new patient.   Assessment/Plan:  Chronic Problems Addressed Today: T2DM (type 2 diabetes mellitus) (HCC) Last A1c 8.9.  Not currently on any medications.  We will start Mounjaro 2.5 mg weekly and then increase to 5 mg weekly.  We discussed potential side effects.  He will follow-up with in a few months and we can recheck A1c at that time.  Hypertension associated with diabetes (HCC) At goal on lisinopril 10 mg daily.  Will continue for now.  Will be treating his likely underlying OSA as below which should hopefully help with blood pressure control as well.  Dyslipidemia associated with type 2 diabetes mellitus (HCC) Last LDL 100.  He is on Lipitor 10 mg daily.  Tolerating well.  Will recheck lipid panel when he comes back in for CPE later this year.  Sleep-disordered breathing Likely has underlying OSA.  Will place referral for sleep study.  Lumbar degenerative disc disease No red flags.  Still has quite a bit of symptoms.  Hopefully will have some improvement with weight loss.     Subjective:  HPI:  See A/P for status of chronic conditions.  Patient is here to establish care.  He is currently only taking lipitor 10mg  daily and lisinopril 10mg  daily.  Doing well with both these medications.  He also has a history of diabetes.  Not currently taking anything for this.  Could not tolerate metformin due to GI side effects.  His previous PCP also prescribed Farxiga and [place] Pepper however has some issues with insurance approving these.  He is working on lifestyle modifications.  His last A1c was 8.9 a few months ago.  He is also concerned about possible sleep apnea.  This is but going on for years.  Wife states that he snores and also he does have witnessed apneic episodes.  Feels very tired throughout the day.   ROS: Per HPI, otherwise a complete review of systems was negative.   PMH:  The following  were reviewed and entered/updated in epic: Past Medical History:  Diagnosis Date   Diabetes mellitus without complication (HCC)    Hypertension    Lumbar herniated disc    Pilonidal disease    Psoriasis    Sciatica 2017   Left   Wears contact lenses    Wears glasses    Patient Active Problem List   Diagnosis Date Noted   T2DM (type 2 diabetes mellitus) (HCC) 05/13/2022   Nephrolithiasis 05/13/2022   Dyslipidemia associated with type 2 diabetes mellitus (HCC) 05/13/2022   Sleep-disordered breathing 05/13/2022   Lumbar degenerative disc disease 04/05/2018   Hypertension associated with diabetes (HCC) 11/19/2015    Class: Chronic   Pilonidal disease s/p excision 12/04/2017 11/19/2015   Past Surgical History:  Procedure Laterality Date   ABCESS DRAINAGE     Pilonidal cyst   LUMBAR LAMINECTOMY/DECOMPRESSION MICRODISCECTOMY Left 04/08/2018   Procedure: Left L5-S1 discectomy;  Surgeon: Venita Lick, MD;  Location: [facility] OR;  Service: Orthopedics;  Laterality: Left;  120 mins   PILONIDAL CYST EXCISION N/A 12/04/2017   Procedure: EXCISION OF PILONIDAL DISEASE;  Surgeon: Karie Soda, MD;  Location: [facility] ;  Service: General;  Laterality: N/A;   WISDOM TOOTH EXTRACTION      Family History  Problem Relation Age of Onset   Hypertension Father    Diabetes Father    Inflammatory bowel disease Sister  Medications- reviewed and updated Current Outpatient Medications  Medication Sig Dispense Refill   atorvastatin (LIPITOR) 10 MG tablet Take 1 tablet (10 mg total) by mouth daily. 30 tablet 1   fluorometholone (FML) 0.1 % ophthalmic suspension Instill 1 drop into right eye four times a day as directed 5 mL 0   lisinopril (ZESTRIL) 10 MG tablet Take 1 tablet (10 mg total) by mouth in the morning. 90 tablet 1   tirzepatide (MOUNJARO) 2.5 MG/0.5ML Pen Inject 2.5 mg into the skin once a week. 2 mL 0   tirzepatide (MOUNJARO) 5 MG/0.5ML Pen Inject 5 mg into the skin once  a week. 6 mL 3   No current facility-administered medications for this visit.    Allergies-reviewed and updated No Known Allergies  Social History   Socioeconomic History   Marital status: Married    Spouse name: Not on file   Number of children: Not on file   Years of education: Not on file   Highest education level: Not on file  Occupational History   Not on file  Tobacco Use   Smoking status: Never   Smokeless tobacco: Never   Tobacco comments:    once monthly  Vaping Use   Vaping Use: Never used  Substance and Sexual Activity   Alcohol use: Not Currently   Drug use: No   Sexual activity: Not on file  Other Topics Concern   Not on file  Social History Narrative   ** Merged History Encounter **       Social Determinants of Health   Financial Resource Strain: Not on file  Food Insecurity: Not on file  Transportation Needs: Not on file  Physical Activity: Not on file  Stress: Not on file  Social Connections: Not on file          Objective:  Physical Exam: BP 123/88   Pulse 86   Temp (!) 97.5 F (36.4 C) (Temporal)   Ht 6\' 3"  (1.905 m)   Wt (!) 308 lb (139.7 kg)   SpO2 95%   BMI 38.50 kg/m   Gen: No acute distress, resting comfortably CV: Regular rate and rhythm with no murmurs appreciated Pulm: Normal work of breathing, clear to auscultation bilaterally with no crackles, wheezes, or rhonchi Neuro: Grossly normal, moves all extremities Psych: Normal affect and thought content      Shamikia Linskey M. Jimmey Ralph, MD 05/13/2022 9:10 AM

## 2022-05-13 NOTE — Patient Instructions (Signed)
It was very nice to see you today!  Please start the [facility].  Take 2.5 mg weekly and then increase to 5 mg weekly.  Please continue to work on diet and exercise.  I will refer you for your sleep study.  Return in about 3 months (around 08/12/2022).   Take care, Dr Jimmey Ralph  PLEASE NOTE:  If you had any lab tests, please let us know if you have not heard back within a few days. You may see your results on mychart before we have a chance to review them but we will give you a call once they are reviewed by [place].   If we ordered any referrals today, please let us know if you have not heard from their office within the next week.   If you had any urgent prescriptions sent in today, please check with the pharmacy within an hour of our visit to make sure the prescription was transmitted appropriately.   Please try these tips to maintain a healthy lifestyle:  Eat at least 3 REAL meals and 1-2 snacks per day.  Aim for no more than 5 hours between eating.  If you eat breakfast, please do so within one hour of getting up.   Each meal should contain half fruits/vegetables, one quarter protein, and one quarter carbs (no bigger than a computer mouse)  Cut down on sweet beverages. This includes juice, soda, and sweet tea.   Drink at least 1 glass of water with each meal and aim for at least 8 glasses per day  Exercise at least 150 minutes every week.

## 2022-05-13 NOTE — Assessment & Plan Note (Signed)
No red flags.  Still has quite a bit of symptoms.  Hopefully will have some improvement with weight loss.

## 2022-05-13 NOTE — Assessment & Plan Note (Signed)
Likely has underlying OSA.  Will place referral for sleep study.

## 2022-05-13 NOTE — Assessment & Plan Note (Signed)
At goal on lisinopril 10 mg daily.  Will continue for now.  Will be treating his likely underlying OSA as below which should hopefully help with blood pressure control as well.

## 2022-05-13 NOTE — Assessment & Plan Note (Signed)
Last LDL 100.  He is on Lipitor 10 mg daily.  Tolerating well.  Will recheck lipid panel when he comes back in for CPE later this year.

## 2022-05-13 NOTE — Assessment & Plan Note (Signed)
Last A1c 8.9.  Not currently on any medications.  We will start Mounjaro 2.5 mg weekly and then increase to 5 mg weekly.  We discussed potential side effects.  He will follow-up with in a few months and we can recheck A1c at that time.

## 2022-05-19 ENCOUNTER — Other Ambulatory Visit (HOSPITAL_COMMUNITY): Payer: Self-pay

## 2022-05-19 ENCOUNTER — Other Ambulatory Visit: Payer: Self-pay

## 2022-05-22 ENCOUNTER — Other Ambulatory Visit (HOSPITAL_COMMUNITY): Payer: Self-pay

## 2022-05-23 ENCOUNTER — Other Ambulatory Visit: Payer: Self-pay

## 2022-05-24 ENCOUNTER — Other Ambulatory Visit (HOSPITAL_BASED_OUTPATIENT_CLINIC_OR_DEPARTMENT_OTHER): Payer: Self-pay

## 2022-05-27 ENCOUNTER — Telehealth: Payer: Self-pay | Admitting: Family Medicine

## 2022-05-27 ENCOUNTER — Other Ambulatory Visit (HOSPITAL_COMMUNITY): Payer: Self-pay

## 2022-05-27 NOTE — Telephone Encounter (Signed)
Patient states Pharmacy is waiting on a PA for the 2 RX's for [facility]

## 2022-05-28 ENCOUNTER — Other Ambulatory Visit (HOSPITAL_COMMUNITY): Payer: Self-pay

## 2022-05-28 NOTE — Telephone Encounter (Signed)
Pt is ca

## 2022-05-30 ENCOUNTER — Other Ambulatory Visit (HOSPITAL_COMMUNITY): Payer: Self-pay

## 2022-06-04 ENCOUNTER — Other Ambulatory Visit (HOSPITAL_COMMUNITY): Payer: Self-pay

## 2022-06-04 ENCOUNTER — Telehealth: Payer: Self-pay

## 2022-06-04 NOTE — Telephone Encounter (Signed)
Pharmacy Patient Advocate Encounter   Received notification that prior authorization for Kyle [name] is required/requested.   PA submitted on 06/04/22 to (ins) [employer] [facility] via telephone at [phone]  Status is pending  Faxed chart notes to [phone]

## 2022-06-06 NOTE — Telephone Encounter (Signed)
Faxed additional information to [employer] prior authorization.

## 2022-06-06 NOTE — Telephone Encounter (Signed)
Pharmacy Patient Advocate Encounter  Prior Authorization for Kyle [name] has been approved by [employer] (ins).    PA # N/A Effective dates: 06/06/2022 through 01/12/2038

## 2022-06-10 NOTE — Telephone Encounter (Signed)
LVM to patient with information PA was approved

## 2022-06-11 ENCOUNTER — Other Ambulatory Visit (HOSPITAL_COMMUNITY): Payer: Self-pay

## 2022-06-11 ENCOUNTER — Other Ambulatory Visit (HOSPITAL_BASED_OUTPATIENT_CLINIC_OR_DEPARTMENT_OTHER): Payer: Self-pay

## 2022-06-13 ENCOUNTER — Other Ambulatory Visit (HOSPITAL_COMMUNITY): Payer: Self-pay

## 2022-06-18 ENCOUNTER — Encounter: Payer: Self-pay | Admitting: Neurology

## 2022-06-18 ENCOUNTER — Ambulatory Visit (INDEPENDENT_AMBULATORY_CARE_PROVIDER_SITE_OTHER): Payer: Managed Care, Other (non HMO) | Admitting: Neurology

## 2022-06-18 VITALS — BP 158/100 | HR 83 | Ht 75.0 in | Wt 308.2 lb

## 2022-06-18 DIAGNOSIS — E669 Obesity, unspecified: Secondary | ICD-10-CM

## 2022-06-18 DIAGNOSIS — Z9189 Other specified personal risk factors, not elsewhere classified: Secondary | ICD-10-CM

## 2022-06-18 DIAGNOSIS — R0683 Snoring: Secondary | ICD-10-CM | POA: Diagnosis not present

## 2022-06-18 DIAGNOSIS — R030 Elevated blood-pressure reading, without diagnosis of hypertension: Secondary | ICD-10-CM

## 2022-06-18 DIAGNOSIS — G4719 Other hypersomnia: Secondary | ICD-10-CM | POA: Diagnosis not present

## 2022-06-18 DIAGNOSIS — R0681 Apnea, not elsewhere classified: Secondary | ICD-10-CM | POA: Diagnosis not present

## 2022-06-18 NOTE — Patient Instructions (Signed)

## 2022-06-18 NOTE — Progress Notes (Signed)
Subjective:    Patient ID: EARNEST REGINA is a 42 y.o. male.  HPI    Huston Foley, MD, PhD [facility] [address], [address] [address] [place], [place] [address]  Dear Dr. Jimmey Ralph,  I saw your patient, Kendryck Galante, upon your kind request in my sleep clinic today for initial consultation of his sleep disorder, in particular, concern for underlying obstructive sleep apnea.  The patient is unaccompanied today.  As you know, Mr. Omdahl is a 42 year old male with an underlying medical history of hypertension, hyperlipidemia, diabetes, degenerative lumbar disc disease with sciatica, nephrolithiasis, psoriasis, and obesity, who reports snoring and some daytime tiredness, as well as witnessed apneas per wife's report.  His Epworth sleepiness score is 7 out of 24, fatigue severity score is 37 out of 63.  I reviewed your office note from 05/13/2022.  Reports that sleep apnea concerns have been going on for years.  He has an elevated blood pressure value today.  He denies any recurrent headaches, particularly nocturnal or morning headaches.  He has no nightly nocturia.  Bedtime is generally between 9:30 PM and 10 PM and rise time around 6 AM.  He lives with his family which includes his wife and daughters.  He has a total of 5 daughters, the oldest 1 lives in [place], the other 4 daughters live at home, second oldest getting ready to go to college.  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  They have 3 dogs in the household, none of the dogs sleep in the bedroom with them.  He is not aware of any family history of sleep apnea.  He drinks caffeine in the form of coffee, usually 1 large cup in the morning.  He drinks occasional soda.  He is a non-smoker and drinks alcohol occasionally to rarely.  His weight has been more or less stable.  His Past Medical History Is Significant For: Past Medical History:  Diagnosis Date   Diabetes mellitus without complication (HCC)     Hypertension    Lumbar herniated disc    Pilonidal disease    Psoriasis    Sciatica 2017   Left   Wears contact lenses    Wears glasses     His Past Surgical History Is Significant For: Past Surgical History:  Procedure Laterality Date   ABCESS DRAINAGE     Pilonidal cyst   LUMBAR LAMINECTOMY/DECOMPRESSION MICRODISCECTOMY Left 04/08/2018   Procedure: Left L5-S1 discectomy;  Surgeon: Venita Lick, MD;  Location: [facility] OR;  Service: Orthopedics;  Laterality: Left;  120 mins   PILONIDAL CYST EXCISION N/A 12/04/2017   Procedure: EXCISION OF PILONIDAL DISEASE;  Surgeon: Karie Soda, MD;  Location: [facility];  Service: General;  Laterality: N/A;   WISDOM TOOTH EXTRACTION      His Family History Is Significant For: Family History  Problem Relation Age of Onset   Hypertension Father    Diabetes Father    Inflammatory bowel disease Sister     His Social History Is Significant For: Social History   Socioeconomic History   Marital status: Married    Spouse name: Not on file   Number of children: Not on file   Years of education: Not on file   Highest education level: Not on file  Occupational History   Not on file  Tobacco Use   Smoking status: Never   Smokeless tobacco: Never   Tobacco comments:    once monthly  Vaping Use  Vaping Use: Never used  Substance and Sexual Activity   Alcohol use: Not Currently   Drug use: No   Sexual activity: Not on file  Other Topics Concern   Not on file  Social History Narrative   ** Merged History Encounter **       Social Determinants of Health   Financial Resource Strain: Not on file  Food Insecurity: Not on file  Transportation Needs: Not on file  Physical Activity: Not on file  Stress: Not on file  Social Connections: Not on file    His Allergies Are:  No Known Allergies:   His Current Medications Are:  Outpatient Encounter Medications as of 06/18/2022  Medication Sig   atorvastatin (LIPITOR) 10 MG  tablet Take 1 tablet (10 mg total) by mouth daily.   lisinopril (ZESTRIL) 10 MG tablet Take 1 tablet (10 mg total) by mouth in the morning.   tirzepatide [facility]) 2.5 MG/0.5ML Pen Inject 2.5 mg into the skin once a week.   tirzepatide [facility]) 5 MG/0.5ML Pen Inject 5 mg into the skin once a week.   fluorometholone (FML) 0.1 % ophthalmic suspension Instill 1 drop into right eye four times a day as directed (Patient not taking: Reported on 06/18/2022)   No facility-administered encounter medications on file as of 06/18/2022.  :   Review of Systems:  Out of a complete 14 point review of systems, all are reviewed and negative with the exception of these symptoms as listed below:   Review of Systems  Neurological:        Patient states excessive snoring, fatigue and witnessed apneic episodes. ESS 7 FSS 37    Objective:  Neurological Exam  Physical Exam Physical Examination:   Vitals:   06/18/22 0858  BP: (!) 158/100  Pulse: 83    General Examination: The patient is a very pleasant 42 y.o. male in no acute distress. He appears well-developed and well-nourished and well groomed.   HEENT: Normocephalic, atraumatic, pupils are equal, round and reactive to light, extraocular tracking is good without limitation to gaze excursion or nystagmus noted. Hearing is grossly intact. Face is symmetric with normal facial animation. Speech is clear with no dysarthria noted. There is no hypophonia. There is no lip, neck/head, jaw or voice tremor. Neck is supple with full range of passive and active motion. There are no carotid bruits on auscultation. Oropharynx exam reveals: mild mouth dryness, adequate dental hygiene and moderate airway crowding, due to smaller airway entry, Mallampati class III. Tongue protrudes centrally and palate elevates symmetrically small tag posterior right tongue area. Tonsils are 1+ in size. Neck size is 19.5 inches.  He has a minimal overbite.   Chest: Clear to auscultation  without wheezing, rhonchi or crackles noted.  Heart: S1+S2+0, regular and normal without murmurs, rubs or gallops noted.   Abdomen: Soft, non-tender and non-distended.  Extremities: There is no obvious edema in the distal lower extremities bilaterally.   Skin: Warm and dry without trophic changes noted.   Musculoskeletal: exam reveals no obvious joint deformities.   Neurologically:  Mental status: The patient is awake, alert and oriented in all 4 spheres. His immediate and remote memory, attention, language skills and fund of knowledge are appropriate. There is no evidence of aphasia, agnosia, apraxia or anomia. Speech is clear with normal prosody and enunciation. Thought process is linear. Mood is normal and affect is normal.  Cranial nerves II - XII are as described above under HEENT exam.  Motor exam: Normal bulk,  strength and tone is noted. There is no obvious action or resting tremor.  Fine motor skills and coordination: grossly intact.  Cerebellar testing: No dysmetria or intention tremor. There is no truncal or gait ataxia.  Sensory exam: intact to light touch in the upper and lower extremities.  Gait, station and balance: He stands easily. No veering to one side is noted. No leaning to one side is noted. Posture is age-appropriate and stance is narrow based. Gait shows normal stride length and normal pace. No problems turning are noted.   Assessment and Plan:  In summary, RACHAD CANNELLA is a very pleasant 42 y.o.-year old male with an underlying medical history of hypertension, hyperlipidemia, diabetes, degenerative lumbar disc disease with sciatica, nephrolithiasis, psoriasis, and obesity, whose history and physical exam are concerning for sleep disordered breathing, particularly obstructive sleep apnea (OSA) versus upper airway resistance syndrome (UARS) versus central sleep apnea (CSA), or mixed sleep apnea.  While a laboratory attended sleep study is typically considered "gold  standard" for evaluation of sleep disordered breathing, we mutually agreed to pursue a home sleep test at this time.   I had a long chat with the patient about my findings and the diagnosis of sleep apnea, particularly OSA, its prognosis and treatment options. We talked about medical/conservative treatments, surgical interventions and non-pharmacological approaches for symptom control. I explained, in particular, the risks and ramifications of untreated moderate to severe OSA, especially with respect to developing cardiovascular disease down the road, including congestive heart failure (CHF), difficult to treat hypertension, cardiac arrhythmias (particularly A-fib), neurovascular complications including TIA, stroke and dementia. Even type 2 diabetes has, in part, been linked to untreated OSA. Symptoms of untreated OSA may include (but may not be limited to) daytime sleepiness, nocturia (i.e. frequent nighttime urination), memory problems, mood irritability and suboptimally controlled or worsening mood disorder such as depression and/or anxiety, lack of energy, lack of motivation, physical discomfort, as well as recurrent headaches, especially morning or nocturnal headaches. We talked about the importance of maintaining a healthy lifestyle and striving for healthy weight. In addition, we talked about the importance of striving for and maintaining good sleep hygiene. I recommended a sleep study at this time. I outlined the differences between a laboratory attended sleep study which is considered more comprehensive and accurate over the option of a home sleep test (HST); the latter may lead to underestimation of sleep disordered breathing in some instances and does not help with diagnosing upper airway resistance syndrome and is not accurate enough to diagnose primary central sleep apnea typically. I outlined possible surgical and non-surgical treatment options of OSA, including the use of a positive airway pressure  (PAP) device (i.e. CPAP, AutoPAP/APAP or BiPAP in certain circumstances), a custom-made dental device (aka oral appliance, which would require a referral to a specialist dentist or orthodontist typically, and is generally speaking not considered for patients with full dentures or edentulous state), upper airway surgical options, such as traditional UPPP (which is not considered a first-line treatment) or the Inspire device (hypoglossal nerve stimulator, which would involve a referral for consultation with an ENT surgeon, after careful selection, following inclusion criteria - also not first-line treatment). I explained the PAP treatment option to the patient in detail, as this is generally considered first-line treatment.  The patient indicated that he would be willing to try PAP therapy, if the need arises. I explained the importance of being compliant with PAP treatment, not only for insurance purposes but primarily to improve patient's  symptoms symptoms, and for the patient's long term health benefit, including to reduce His cardiovascular risks longer-term.    We will pick up our discussion about the next steps and treatment options after testing.  We will keep him posted as to the test results by phone call and/or MyChart messaging where possible.  We will plan to follow-up in sleep clinic accordingly as well.  I answered all his questions today and the patient was in agreement.   I encouraged him to call with any interim questions, concerns, problems or updates or email [place] through MyChart.  Generally speaking, sleep test authorizations may take up to 2 weeks, sometimes less, sometimes longer, the patient is encouraged to get in touch with [place] if they do not hear back from the sleep lab staff directly within the next 2 weeks.  Thank you very much for allowing me to participate in the care of this nice patient. If I can be of any further assistance to you please do not hesitate to call me at  [phone].  Sincerely,   Huston Foley, MD, PhD

## 2022-06-26 ENCOUNTER — Other Ambulatory Visit (HOSPITAL_BASED_OUTPATIENT_CLINIC_OR_DEPARTMENT_OTHER): Payer: Self-pay

## 2022-06-27 ENCOUNTER — Other Ambulatory Visit (HOSPITAL_BASED_OUTPATIENT_CLINIC_OR_DEPARTMENT_OTHER): Payer: Self-pay

## 2022-06-27 MED ORDER — ATORVASTATIN CALCIUM 10 MG PO TABS
10.0000 mg | ORAL_TABLET | Freq: Every day | ORAL | 1 refills | Status: DC
  Filled 2022-06-27: qty 30, 30d supply, fill #0
  Filled 2022-07-20 – 2022-07-21 (×2): qty 30, 30d supply, fill #1

## 2022-07-01 ENCOUNTER — Ambulatory Visit: Payer: Managed Care, Other (non HMO) | Admitting: Neurology

## 2022-07-01 DIAGNOSIS — R0681 Apnea, not elsewhere classified: Secondary | ICD-10-CM

## 2022-07-01 DIAGNOSIS — E669 Obesity, unspecified: Secondary | ICD-10-CM

## 2022-07-01 DIAGNOSIS — G4733 Obstructive sleep apnea (adult) (pediatric): Secondary | ICD-10-CM | POA: Diagnosis not present

## 2022-07-01 DIAGNOSIS — G4719 Other hypersomnia: Secondary | ICD-10-CM

## 2022-07-01 DIAGNOSIS — Z9189 Other specified personal risk factors, not elsewhere classified: Secondary | ICD-10-CM

## 2022-07-01 DIAGNOSIS — R0683 Snoring: Secondary | ICD-10-CM

## 2022-07-01 DIAGNOSIS — R030 Elevated blood-pressure reading, without diagnosis of hypertension: Secondary | ICD-10-CM

## 2022-07-02 ENCOUNTER — Other Ambulatory Visit (HOSPITAL_COMMUNITY): Payer: Self-pay

## 2022-07-10 ENCOUNTER — Telehealth: Payer: Self-pay

## 2022-07-10 NOTE — Telephone Encounter (Signed)
Contacted pt regarding SSR, LVM rq call back.  

## 2022-07-10 NOTE — Procedures (Signed)
[facility]  HOME SLEEP TEST (Watch PAT) REPORT   -  Mail-out Device  STUDY DATE: 07/05/22  DOB: Jan 18, 1980  MRN: [id number]  ORDERING CLINICIAN: Huston Foley, MD, PhD   REFERRING CLINICIAN: Ardith Dark, MD   CLINICAL INFORMATION/HISTORY: 42 year old male with an underlying medical history of hypertension, hyperlipidemia, diabetes, degenerative lumbar disc disease with sciatica, nephrolithiasis, psoriasis, and obesity, who reports snoring and some daytime tiredness, as well as witnessed apneas.  Epworth sleepiness score: 7/24.  BMI: 38.8 kg/m  FINDINGS:   Sleep Summary:   Total Recording Time (hours, min): 7 hours, 50 min  Total Sleep Time (hours, min):  7 hours, 8 min  Percent REM (%):    26.3%   Respiratory Indices:   Calculated pAHI (per hour):  15.4/hour         REM pAHI:    25.8/hour       NREM pAHI: 12.1/hour  Central pAHI: 0.2/hour  Oxygen Saturation Statistics:    Oxygen Saturation (%) Mean: 94%   Minimum oxygen saturation (%):                 89%   O2 Saturation Range (%): 89-99%    O2 Saturation (minutes) <=88%: 0 min  Pulse Rate Statistics:   Pulse Mean (bpm):    66/min    Pulse Range (44 - 100/min)   IMPRESSION: OSA (obstructive sleep apnea)   RECOMMENDATION:  This home sleep test demonstrates moderate obstructive sleep apnea - by number of events - with a total AHI of 15.4/hour and O2 nadir of 89%.  Moderate to loud snoring was detected throughout the night, and at times intermittently in the milder range. Treatment with a positive airway pressure (PAP) device is recommended. The patient will be advised to proceed with an autoPAP titration/trial at home for now. A full night titration study may be considered to optimize treatment settings, monitor proper oxygen saturations and aid with improvement of tolerance and adherence, if needed down the road. Alternative treatment options may include a dental device through  dentistry or orthodontics in selected patients or Inspire (hypoglossal nerve stimulator) in carefully selected patients (meeting inclusion criteria).  Concomitant weight loss is recommended (where clinically appropriate). Please note that untreated obstructive sleep apnea may carry additional perioperative morbidity. Patients with significant obstructive sleep apnea should receive perioperative PAP therapy and the surgeons and particularly the anesthesiologist should be informed of the diagnosis and the severity of the sleep disordered breathing. The patient should be cautioned not to drive, work at heights, or operate dangerous or heavy equipment when tired or sleepy. Review and reiteration of good sleep hygiene measures should be pursued with any patient. Other causes of the patient's symptoms, including circadian rhythm disturbances, an underlying mood disorder, medication effect and/or an underlying medical problem cannot be ruled out based on this test. Clinical correlation is recommended.  The patient and his referring provider will be notified of the test results. The patient will be seen in follow up in sleep clinic at [facility].  I certify that I have reviewed the raw data recording prior to the issuance of this report in accordance with the standards of the American Academy of Sleep Medicine (AASM).    INTERPRETING PHYSICIAN:   Huston Foley, MD, PhD Medical Director, [facility]) Diplomat, ABPN (Neurology and Sleep)   [facility] [address], [address] [place], [place] [address] [phone]

## 2022-07-10 NOTE — Telephone Encounter (Signed)
-----   Message from Huston Foley, MD sent at 07/10/2022  9:52 AM EDT ----- Patient referred by PCP, seen by me on 06/18/22, patient had a HST on 07/05/22.    Please call and notify the patient that the recent home sleep test showed obstructive sleep apnea in the moderate range. I recommend treatment in the form of autoPAP, which means, that we don't have to bring him in for a sleep study with CPAP, but will let him start using a so called autoPAP machine at home, which is a CPAP-like machine with self-adjusting pressures. We will send the order to a local DME company (of his choice, or as per insurance requirement). The DME representative will fit him with a mask, educate him on how to use the machine, how to put the mask on, etc. I have placed an order in the chart. Please send the order, talk to patient, send report to referring MD. We will need a FU in sleep clinic for 10 weeks post-PAP set up, please arrange that with me or one of our NPs. Also reinforce the need for compliance with treatment. Thanks,   Huston Foley, MD, PhD [facility])

## 2022-07-10 NOTE — Addendum Note (Signed)
Addended by: Huston Foley on: 07/10/2022 09:52 AM   Modules accepted: Orders

## 2022-07-10 NOTE — Telephone Encounter (Addendum)
I called pt. I advised pt that Dr. Frances Furbish reviewed their sleep study results and found that pt moderate OSA. Dr. Frances Furbish recommends that pt startes autopap. I reviewed PAP compliance expectations with the pt. Pt is agreeable to starting a CPAP. I advised pt that an order will be sent to a DME, [facility], and [facility] will call the pt within about one week after they file with the pt's insurance. [facility] will show the pt how to use the machine, fit for masks, and troubleshoot the CPAP if needed. A follow up appt was made for insurance purposes with Dr. Frances Furbish 10/07/22. Pt verbalized understanding to arrive 15 minutes early and bring their CPAP. Pt verbalized understanding of results. Pt had no questions at this time but was encouraged to call back if questions arise. I have sent the order to [facility] and have received confirmation that they have received the order. Report sent to PCP

## 2022-07-10 NOTE — Telephone Encounter (Signed)
Zott, Stacy  Josetta Wigal, Abbe Amsterdam, CMA; Melvern Sample; Zott, Kennyth Arnold Got it       Previous Messages    ----- Message ----- From: Bobbye Morton, CMA Sent: 07/10/2022  11:06 AM EDT To: Melvern Sample; Kennyth Arnold Zott Subject: Autopap                                        New orders have been placed for the above pt, DOB: 1980-05-07 Thanks

## 2022-07-20 ENCOUNTER — Other Ambulatory Visit (HOSPITAL_COMMUNITY): Payer: Self-pay

## 2022-07-21 ENCOUNTER — Other Ambulatory Visit: Payer: Self-pay

## 2022-07-21 ENCOUNTER — Other Ambulatory Visit (HOSPITAL_COMMUNITY): Payer: Self-pay

## 2022-07-21 ENCOUNTER — Other Ambulatory Visit (HOSPITAL_BASED_OUTPATIENT_CLINIC_OR_DEPARTMENT_OTHER): Payer: Self-pay

## 2022-07-21 MED ORDER — LISINOPRIL 10 MG PO TABS
10.0000 mg | ORAL_TABLET | Freq: Every morning | ORAL | 1 refills | Status: DC
  Filled 2022-07-21: qty 30, 30d supply, fill #0
  Filled 2022-08-21: qty 30, 30d supply, fill #1
  Filled 2022-09-20: qty 30, 30d supply, fill #2
  Filled 2022-10-29: qty 30, 30d supply, fill #3
  Filled 2022-11-23: qty 30, 30d supply, fill #4
  Filled 2023-01-02: qty 30, 30d supply, fill #5

## 2022-07-24 ENCOUNTER — Other Ambulatory Visit (HOSPITAL_BASED_OUTPATIENT_CLINIC_OR_DEPARTMENT_OTHER): Payer: Self-pay

## 2022-08-12 ENCOUNTER — Ambulatory Visit (INDEPENDENT_AMBULATORY_CARE_PROVIDER_SITE_OTHER): Payer: Managed Care, Other (non HMO) | Admitting: Family Medicine

## 2022-08-12 ENCOUNTER — Other Ambulatory Visit (HOSPITAL_BASED_OUTPATIENT_CLINIC_OR_DEPARTMENT_OTHER): Payer: Self-pay

## 2022-08-12 ENCOUNTER — Encounter: Payer: Self-pay | Admitting: Family Medicine

## 2022-08-12 VITALS — BP 123/88 | HR 93 | Temp 97.7°F | Ht 75.0 in | Wt 304.0 lb

## 2022-08-12 DIAGNOSIS — E785 Hyperlipidemia, unspecified: Secondary | ICD-10-CM

## 2022-08-12 DIAGNOSIS — E1169 Type 2 diabetes mellitus with other specified complication: Secondary | ICD-10-CM | POA: Diagnosis not present

## 2022-08-12 DIAGNOSIS — I152 Hypertension secondary to endocrine disorders: Secondary | ICD-10-CM

## 2022-08-12 DIAGNOSIS — Z7985 Long-term (current) use of injectable non-insulin antidiabetic drugs: Secondary | ICD-10-CM | POA: Diagnosis not present

## 2022-08-12 DIAGNOSIS — E1159 Type 2 diabetes mellitus with other circulatory complications: Secondary | ICD-10-CM | POA: Diagnosis not present

## 2022-08-12 LAB — POCT GLYCOSYLATED HEMOGLOBIN (HGB A1C): Hemoglobin A1C: 7.8 % — AB (ref 4.0–5.6)

## 2022-08-12 LAB — MICROALBUMIN / CREATININE URINE RATIO
Creatinine,U: 348.4 mg/dL
Microalb Creat Ratio: 0.3 mg/g (ref 0.0–30.0)
Microalb, Ur: 1.1 mg/dL (ref 0.0–1.9)

## 2022-08-12 MED ORDER — TIRZEPATIDE 7.5 MG/0.5ML ~~LOC~~ SOAJ
7.5000 mg | SUBCUTANEOUS | 0 refills | Status: DC
  Filled 2022-08-12: qty 2, 28d supply, fill #0

## 2022-08-12 NOTE — Patient Instructions (Addendum)
It was very nice to see you today!  Your A1c today is 7.8.  We will increase your Mounjaro to 7.5 mg weekly.    Please send me a message in a few weeks to let me know how you are doing with this and we can increase the dose as needed.  Return in about 3 months (around 11/12/2022) for Annual Physical.   Take care, Dr Jimmey Ralph  PLEASE NOTE:  If you had any lab tests, please let us know if you have not heard back within a few days. You may see your results on mychart before we have a chance to review them but we will give you a call once they are reviewed by [place].   If we ordered any referrals today, please let us know if you have not heard from their office within the next week.   If you had any urgent prescriptions sent in today, please check with the pharmacy within an hour of our visit to make sure the prescription was transmitted appropriately.   Please try these tips to maintain a healthy lifestyle:  Eat at least 3 REAL meals and 1-2 snacks per day.  Aim for no more than 5 hours between eating.  If you eat breakfast, please do so within one hour of getting up.   Each meal should contain half fruits/vegetables, one quarter protein, and one quarter carbs (no bigger than a computer mouse)  Cut down on sweet beverages. This includes juice, soda, and sweet tea.   Drink at least 1 glass of water with each meal and aim for at least 8 glasses per day  Exercise at least 150 minutes every week.

## 2022-08-12 NOTE — Assessment & Plan Note (Signed)
On Lipitor 10 mg daily.  He is coming back in 3 months for CPE.  Will check lipids at that time.

## 2022-08-12 NOTE — Progress Notes (Signed)
Great news! Urine test is normal.  Haidan Nhan M. Jimmey Ralph, MD 08/12/2022 1:05 PM

## 2022-08-12 NOTE — Progress Notes (Signed)
   Kyle [name] is a 42 y.o. male who presents today for an office visit.  Assessment/Plan:  Chronic Problems Addressed Today: T2DM (type 2 diabetes mellitus) (HCC) A1c improved to 7.8.  Tolerating Mounjaro well.  Initially did have some nausea but now not having any side effects.  Will increase to 7.5 mg weekly.  He will send a message in a few weeks via MyChart and we can titrate the dose as tolerated.  Ideally would like to get him on his maximally tolerated dose.  He will come back in 3 months for CPE and we will check A1c at that time.  Hypertension associated with diabetes (HCC) Blood pressure well-controlled today on lisinopril 10 mg daily.  Dyslipidemia associated with type 2 diabetes mellitus (HCC) On Lipitor 10 mg daily.  He is coming back in 3 months for CPE.  Will check lipids at that time.     Subjective:  HPI:  See A/P for status of chronic conditions.  Patient is here today for 65-month follow-up.  He was last seen here 3 months ago for initial visit.  At that time we started Mounjaro 2.5 mg weekly and increase to 5 mg weekly.  Most recent A1c at that time was 8.9.  We also continued him on his other medications including Lipitor 10 mg daily and lisinopril 10 mg daily.       Objective:  Physical Exam: BP 123/88   Pulse 93   Temp 97.7 F (36.5 C) (Temporal)   Ht 6\' 3"  (1.905 m)   Wt (!) 304 lb (137.9 kg)   SpO2 95%   BMI 38.00 kg/m   Wt Readings from Last 3 Encounters:  08/12/22 (!) 304 lb (137.9 kg)  06/18/22 (!) 308 lb 3.2 oz (139.8 kg)  05/13/22 (!) 308 lb (139.7 kg)  Gen: No acute distress, resting comfortably Neuro: Grossly normal, moves all extremities Psych: Normal affect and thought content      Kyle Klabunde M. Jimmey Ralph, MD 08/12/2022 8:12 AM

## 2022-08-12 NOTE — Assessment & Plan Note (Signed)
Blood pressure well-controlled today on lisinopril 10 mg daily.

## 2022-08-12 NOTE — Assessment & Plan Note (Addendum)
A1c improved to 7.8.  Tolerating Mounjaro well.  Initially did have some nausea but now not having any side effects.  Will increase to 7.5 mg weekly.  He will send a message in a few weeks via MyChart and we can titrate the dose as tolerated.  Ideally would like to get him on his maximally tolerated dose.  He will come back in 3 months for CPE and we will check A1c at that time.

## 2022-08-13 ENCOUNTER — Telehealth: Payer: Self-pay | Admitting: Neurology

## 2022-08-13 NOTE — Telephone Encounter (Signed)
I spoke with the patient.  He states the pressure is too high on the machine.  He feels like he is suffocating. He states his ears are popping.  He does have some congestion but he said even before the congestion started he noticed the pressure was too high. I advised that Dr Frances Furbish is out of the office this week but we can have Dr Dohmeier take a look at his report and make changes to pressure if warranted. Pt was appreciative. I will send him a mychart message with the recommendation.

## 2022-08-13 NOTE — Telephone Encounter (Signed)
Pt called stating that he has been having trouble keeping his cpap machine on due to the pressure being higher that what it was originally set to. Pt is wanting to know if he can discuss with MD about lowering the pressure. Please advise.

## 2022-08-13 NOTE — Telephone Encounter (Addendum)
Rx on [employer] MD order. Autopap 6-12 cm H20.

## 2022-08-14 NOTE — Telephone Encounter (Signed)
The patient returned my call.  He states he can feel the pressure in his ears like when you're on an airplane. He said he definitely feels there's too much pressure and not that there's too little pressure. He feels the congestion is making it worse but he noticed too much pressure from the start, before congestion developed. He doesn't feel like he's swallowing air and he denies any known mask leak. He didn't use the cpap last night as instructed and he noticed that it definitely helps and he wants to keep using it. I told him I would consult with Dr Vickey Huger and give him a call back as soon as possible. He was appreciative.

## 2022-08-14 NOTE — Telephone Encounter (Signed)
Pt had called [place] earlier but I was unable to take the call. I tried to call the patient back at 1143 am. I LVM.

## 2022-08-15 NOTE — Telephone Encounter (Signed)
The patient called this morning for an update.  I advised him that Dr. Vickey Huger does not favor changing the pressure on the machine but instead recommends attempting to improve congestion by taking an over-the-counter saline nasal spray and over-the-counter Zyrtec daily for now.  I asked him to give this a try and touch base with [place] early next week. He verbalized understanding and appreciation and his questions were answered.

## 2022-08-21 ENCOUNTER — Other Ambulatory Visit (HOSPITAL_BASED_OUTPATIENT_CLINIC_OR_DEPARTMENT_OTHER): Payer: Self-pay

## 2022-08-21 ENCOUNTER — Other Ambulatory Visit: Payer: Self-pay

## 2022-08-22 ENCOUNTER — Other Ambulatory Visit (HOSPITAL_BASED_OUTPATIENT_CLINIC_OR_DEPARTMENT_OTHER): Payer: Self-pay

## 2022-08-22 MED ORDER — ATORVASTATIN CALCIUM 10 MG PO TABS
10.0000 mg | ORAL_TABLET | Freq: Every day | ORAL | 1 refills | Status: DC
  Filled 2022-08-22 – 2022-08-23 (×2): qty 30, 30d supply, fill #0

## 2022-08-23 ENCOUNTER — Other Ambulatory Visit (HOSPITAL_BASED_OUTPATIENT_CLINIC_OR_DEPARTMENT_OTHER): Payer: Self-pay

## 2022-08-26 ENCOUNTER — Ambulatory Visit (INDEPENDENT_AMBULATORY_CARE_PROVIDER_SITE_OTHER): Payer: Managed Care, Other (non HMO) | Admitting: Physician Assistant

## 2022-08-26 ENCOUNTER — Other Ambulatory Visit: Payer: Self-pay

## 2022-08-26 ENCOUNTER — Emergency Department (HOSPITAL_BASED_OUTPATIENT_CLINIC_OR_DEPARTMENT_OTHER)
Admission: EM | Admit: 2022-08-26 | Discharge: 2022-08-26 | Disposition: A | Discharge status: Home / Self Care | Payer: Managed Care, Other (non HMO)

## 2022-08-26 ENCOUNTER — Other Ambulatory Visit (HOSPITAL_BASED_OUTPATIENT_CLINIC_OR_DEPARTMENT_OTHER): Payer: Self-pay

## 2022-08-26 ENCOUNTER — Encounter (HOSPITAL_BASED_OUTPATIENT_CLINIC_OR_DEPARTMENT_OTHER): Payer: Self-pay | Admitting: Emergency Medicine

## 2022-08-26 VITALS — BP 122/88 | HR 91 | Temp 97.7°F | Ht 75.0 in | Wt 295.8 lb

## 2022-08-26 DIAGNOSIS — I10 Essential (primary) hypertension: Secondary | ICD-10-CM | POA: Diagnosis not present

## 2022-08-26 DIAGNOSIS — R82998 Other abnormal findings in urine: Secondary | ICD-10-CM | POA: Diagnosis not present

## 2022-08-26 DIAGNOSIS — E1169 Type 2 diabetes mellitus with other specified complication: Secondary | ICD-10-CM | POA: Diagnosis not present

## 2022-08-26 DIAGNOSIS — R112 Nausea with vomiting, unspecified: Secondary | ICD-10-CM

## 2022-08-26 DIAGNOSIS — R809 Proteinuria, unspecified: Secondary | ICD-10-CM

## 2022-08-26 DIAGNOSIS — R509 Fever, unspecified: Secondary | ICD-10-CM

## 2022-08-26 DIAGNOSIS — Z20822 Contact with and (suspected) exposure to covid-19: Secondary | ICD-10-CM | POA: Insufficient documentation

## 2022-08-26 DIAGNOSIS — B349 Viral infection, unspecified: Secondary | ICD-10-CM | POA: Insufficient documentation

## 2022-08-26 DIAGNOSIS — Z79899 Other long term (current) drug therapy: Secondary | ICD-10-CM | POA: Diagnosis not present

## 2022-08-26 DIAGNOSIS — R1013 Epigastric pain: Secondary | ICD-10-CM | POA: Diagnosis present

## 2022-08-26 LAB — CBC
HCT: 46.8 % (ref 39.0–52.0)
Hemoglobin: 16 g/dL (ref 13.0–17.0)
MCH: 30 pg (ref 26.0–34.0)
MCHC: 34.2 g/dL (ref 30.0–36.0)
MCV: 87.6 fL (ref 80.0–100.0)
Platelets: 269 10*3/uL (ref 150–400)
RBC: 5.34 MIL/uL (ref 4.22–5.81)
RDW: 13 % (ref 11.5–15.5)
WBC: 11.8 10*3/uL — ABNORMAL HIGH (ref 4.0–10.5)
nRBC: 0 % (ref 0.0–0.2)

## 2022-08-26 LAB — URINALYSIS, ROUTINE W REFLEX MICROSCOPIC
Bacteria, UA: NONE SEEN
Bilirubin Urine: NEGATIVE
Glucose, UA: NEGATIVE mg/dL
Hgb urine dipstick: NEGATIVE
Leukocytes,Ua: NEGATIVE
Nitrite: NEGATIVE
Protein, ur: 30 mg/dL — AB
Specific Gravity, Urine: 1.032 — ABNORMAL HIGH (ref 1.005–1.030)
pH: 6 (ref 5.0–8.0)

## 2022-08-26 LAB — COMPREHENSIVE METABOLIC PANEL
ALT: 28 U/L (ref 0–44)
AST: 16 U/L (ref 15–41)
Albumin: 3.9 g/dL (ref 3.5–5.0)
Alkaline Phosphatase: 62 U/L (ref 38–126)
Anion gap: 9 (ref 5–15)
BUN: 13 mg/dL (ref 6–20)
CO2: 28 mmol/L (ref 22–32)
Calcium: 9.5 mg/dL (ref 8.9–10.3)
Chloride: 100 mmol/L (ref 98–111)
Creatinine, Ser: 1.13 mg/dL (ref 0.61–1.24)
GFR, Estimated: >60 mL/min (ref >60)
Glucose, Bld: 126 mg/dL — ABNORMAL HIGH (ref 70–99)
Potassium: 3.7 mmol/L (ref 3.5–5.1)
Sodium: 137 mmol/L (ref 135–145)
Total Bilirubin: 0.8 mg/dL (ref 0.3–1.2)
Total Protein: 7.6 g/dL (ref 6.5–8.1)

## 2022-08-26 LAB — GLUCOSE, POCT (MANUAL RESULT ENTRY): POC Glucose: 106 mg/dl — AB (ref 70–99)

## 2022-08-26 LAB — POC URINALSYSI DIPSTICK (AUTOMATED)
Glucose, UA: NEGATIVE
Nitrite, UA: NEGATIVE
Protein, UA: POSITIVE — AB
Spec Grav, UA: >=1.03 — AB (ref 1.010–1.025)
Urobilinogen, UA: 1 E.U./dL
pH, UA: 6 (ref 5.0–8.0)

## 2022-08-26 LAB — RESP PANEL BY RT-PCR (RSV, FLU A&B, COVID)  RVPGX2
Influenza A by PCR: NEGATIVE
Influenza B by PCR: NEGATIVE
Resp Syncytial Virus by PCR: NEGATIVE
SARS Coronavirus 2 by RT PCR: NEGATIVE

## 2022-08-26 LAB — LIPASE, BLOOD: Lipase: <10 U/L — ABNORMAL LOW (ref 11–51)

## 2022-08-26 LAB — CK: Total CK: 230 U/L (ref 49–397)

## 2022-08-26 MED ORDER — SODIUM CHLORIDE 0.9 % IV BOLUS
1000.0000 mL | Freq: Once | INTRAVENOUS | Status: AC
  Administered 2022-08-26: 1000 mL via INTRAVENOUS

## 2022-08-26 MED ORDER — ONDANSETRON HCL 8 MG PO TABS
8.0000 mg | ORAL_TABLET | Freq: Three times a day (TID) | ORAL | 0 refills | Status: DC | PRN
  Filled 2022-08-26: qty 18, 20d supply, fill #0

## 2022-08-26 NOTE — Patient Instructions (Signed)
Proceed to [facility] ER on [facility] now.

## 2022-08-26 NOTE — ED Provider Notes (Signed)
[place] EMERGENCY DEPARTMENT AT [facility] Provider Note   CSN: [id number] Arrival date & time: 08/26/22  4098     History  No chief complaint on file.   Kyle [name] is a 42 y.o. male.  Presented to the emergency department today by referral of his PCP after was found to have dark urine.  Patient reportedly had fever Friday with some epigastric discomfort and nausea and vomiting.  His discomfort and nausea vomiting has improved, but still has some generalized malaise and feeling unwell.  Denies headache, vision changes, chest pain, shortness of breath.  No dysuria  HPI     Home Medications Prior to Admission medications   Medication Sig Start Date End Date Taking? Authorizing Provider  atorvastatin (LIPITOR) 10 MG tablet Take 1 tablet (10 mg total) by mouth daily. 08/22/22     fluorometholone (FML) 0.1 % ophthalmic suspension Instill 1 drop into right eye four times a day as directed 12/03/21     lisinopril (ZESTRIL) 10 MG tablet Take 1 tablet (10 mg total) by mouth in the morning. 07/20/22     ondansetron (ZOFRAN) 8 MG tablet Take 1 tablet (8 mg total) by mouth every 8 (eight) hours as needed for nausea or vomiting. 08/26/22   Allwardt, Crist Infante, PA-C  tirzepatide (MOUNJARO) 7.5 MG/0.5ML Pen Inject 7.5 mg into the skin once a week. 08/12/22   Ardith Dark, MD      Allergies    Patient has no known allergies.    Review of Systems   Review of Systems  Physical Exam Updated Vital Signs BP (!) 130/97 (BP Location: Right Arm)   Pulse 82   Temp 98.3 F (36.8 C) (Oral)   Resp 16   SpO2 97%  Physical Exam Vitals and nursing note reviewed.  Constitutional:      General: He is not in acute distress.    Appearance: He is not toxic-appearing.  HENT:     Head: Normocephalic.     Nose: Nose normal.     Mouth/Throat:     Mouth: Mucous membranes are dry.  Eyes:     Conjunctiva/sclera: Conjunctivae normal.  Cardiovascular:     Rate and Rhythm: Normal rate and  regular rhythm.  Pulmonary:     Effort: Pulmonary effort is normal.     Breath sounds: Normal breath sounds.  Abdominal:     General: Abdomen is flat. There is no distension.     Palpations: Abdomen is soft.     Tenderness: There is no abdominal tenderness. There is no guarding or rebound.  Musculoskeletal:        General: Normal range of motion.  Skin:    General: Skin is warm and dry.     Capillary Refill: Capillary refill takes less than 2 seconds.  Neurological:     Mental Status: He is alert and oriented to person, place, and time.  Psychiatric:        Mood and Affect: Mood normal.        Behavior: Behavior normal.     ED Results / Procedures / Treatments   Labs (all labs ordered are listed, but only abnormal results are displayed) Labs Reviewed  CBC - Abnormal; Notable for the following components:      Result Value   WBC 11.8 (*)    All other components within normal limits  COMPREHENSIVE METABOLIC PANEL - Abnormal; Notable for the following components:   Glucose, Bld 126 (*)    All other  components within normal limits  LIPASE, BLOOD - Abnormal; Notable for the following components:   Lipase <10 (*)    All other components within normal limits  URINALYSIS, ROUTINE W REFLEX MICROSCOPIC - Abnormal; Notable for the following components:   Specific Gravity, Urine 1.032 (*)    Ketones, ur TRACE (*)    Protein, ur 30 (*)    All other components within normal limits  RESP PANEL BY RT-PCR (RSV, FLU A&B, COVID)  [id number]  CK    EKG None  Radiology No results found.  Procedures Procedures    Medications Ordered in ED Medications  sodium chloride 0.9 % bolus 1,000 mL (0 mLs Intravenous Stopped 08/26/22 1300)    ED Course/ Medical Decision Making/ A&P Clinical Course as of 08/26/22 1304  Tue Aug 26, 2022  1154 CBC(!) Mild leukocytosis, no fever or tachycardia to suggest systemic infection.  Given symptoms likely viral [TY]  1155 Lipase(!): <10 Pancreatitis  unlikely [TY]  1155 Comprehensive metabolic panel(!) No significant metabolic derangements.  Normal renal function.  No transaminitis to suggest biliary disease.  Total bilirubin is normal.  [TY]  1155 CK Total: 230 Acute rhabdomyolysis unlikely [TY]  1155 Resp panel by RT-PCR (RSV, Flu A&B, Covid) Anterior Nasal Swab Flu COVID-negative. [TY]  1303 Workup reassuring.  Hemodynamically stable.  No further episodes of nausea or vomiting.  Benign abdominal exam.  Does have some protein in his urine, otherwise no sign of infection.  Discussed follow-up with his PCP regarding his proteinuria and further workup.  Return precautions given. [TY]    Clinical Course User Index [TY] Coral Spikes, DO                                 Medical Decision Making This is a well-appearing 42 year old male history of high cholesterol, hypertension, chronic back pain per chart review.  He presented today for generalized malaise and a fever on Friday with nausea and vomiting.  Patient is afebrile here, hemodynamically stable.  He is nontachycardic.  Physical exam reassuring with no overt source of bacterial infection, clinically appears slightly dry.  Will IV hydrate while we we check labs.  Urine dipstick from PCP visit appears to have some protein, and some leuk esterase.  No hemoglobin.  Will repeat UA.  Given is reported to be quite dark will check CK for rhabdomyolysis.  Patient otherwise well-appearing.  If labs reassuring, will discharge with PCP follow-up.  See ED course for further MDM and final disposition.  Amount and/or Complexity of Data Reviewed Labs: ordered. Decision-making details documented in ED Course. Radiology:     Details: Considered imaging, however patient stable vitals, reassuring physical exam no tenderness to his abdomen on exam.  Low suspicion that he has acute intra-abdominal/surgical pathology.  I suspect his symptoms are related to viral  syndrome/gastroenteritis.          Final Clinical Impression(s) / ED Diagnoses Final diagnoses:  None    Rx / DC Orders ED Discharge Orders     None         Coral Spikes, DO 08/26/22 1304

## 2022-08-26 NOTE — ED Notes (Signed)
Dc instructions reviewed with patient. Patient voiced understanding. Dc with belongings.  °

## 2022-08-26 NOTE — Progress Notes (Signed)
Subjective:    Patient ID: Kyle [name], male    DOB: 04-Jan-1981, 42 y.o.   MRN: [id number]  Chief Complaint  Patient presents with   Fever    Pt here for sore right side ribs, hurts with deep breathes, started vomiting on Friday. Pt states has a loss appetite and 100.9 fever, tested negative for Covid on Friday.       Fever    Patient is in today for acute sickness.  Started 08/22/22 - nausea, vomiting, epigastric discomfort, fever; feels like he injured rib in that time with forceful vomiting; Ice pack, ibuprofen / Tylenol - last took this morning.   Today - still some nausea, loss of appetite; fever has still been off and on; diaphoretic starting this morning.   No close sick contacts. No recent travel. No new foods or restaurants.   No abdominal pain, chest pain, SOB. No coughing. No dizziness or headaches. No diarrhea or stool changes. Last BM this morning, normal. Urine is yellow, sometimes darker than others.  States he is feeling somewhat better today.  Of note, also T2DM - taking Mounjaro, last took 5 mg dose on Sunday 08/24/22. (Started Mounjaro end of April).   Has not had anything to eat or drink yet today.    Past Medical History:  Diagnosis Date   Diabetes mellitus without complication (HCC)    Hypertension    Lumbar herniated disc    Pilonidal disease    Psoriasis    Sciatica 2017   Left   Wears contact lenses    Wears glasses     Past Surgical History:  Procedure Laterality Date   ABCESS DRAINAGE     Pilonidal cyst   LUMBAR LAMINECTOMY/DECOMPRESSION MICRODISCECTOMY Left 04/08/2018   Procedure: Left L5-S1 discectomy;  Surgeon: Venita Lick, MD;  Location: [facility] OR;  Service: Orthopedics;  Laterality: Left;  120 mins   PILONIDAL CYST EXCISION N/A 12/04/2017   Procedure: EXCISION OF PILONIDAL DISEASE;  Surgeon: Karie Soda, MD;  Location: [facility];  Service: General;  Laterality: N/A;   WISDOM TOOTH EXTRACTION      Family History   Problem Relation Age of Onset   Hypertension Father    Diabetes Father    Inflammatory bowel disease Sister     Social History   Tobacco Use   Smoking status: Never   Smokeless tobacco: Never   Tobacco comments:    once monthly  Vaping Use   Vaping status: Never Used  Substance Use Topics   Alcohol use: Not Currently   Drug use: No     No Known Allergies  Review of Systems  Constitutional:  Positive for fever.   NEGATIVE UNLESS OTHERWISE INDICATED IN HPI      Objective:     BP 122/88 (BP Location: Left Arm, Patient Position: Sitting, Cuff Size: Normal)   Pulse 91   Temp 97.7 F (36.5 C) (Temporal)   Ht 6\' 3"  (1.905 m)   Wt 295 lb 12.8 oz (134.2 kg)   SpO2 96%   BMI 36.97 kg/m   Wt Readings from Last 3 Encounters:  08/26/22 295 lb 12.8 oz (134.2 kg)  08/12/22 (!) 304 lb (137.9 kg)  06/18/22 (!) 308 lb 3.2 oz (139.8 kg)    BP Readings from Last 3 Encounters:  08/26/22 122/88  08/12/22 123/88  06/18/22 (!) 158/100     Physical Exam Vitals and nursing note reviewed.  Constitutional:      General: He is not  in acute distress.    Appearance: Normal appearance. He is ill-appearing and diaphoretic. He is not toxic-appearing.  HENT:     Head: Normocephalic and atraumatic.     Right Ear: Tympanic membrane, ear canal and external ear normal.     Left Ear: Tympanic membrane, ear canal and external ear normal.     Nose: Nose normal.     Mouth/Throat:     Mouth: Mucous membranes are moist.     Pharynx: Oropharynx is clear.  Eyes:     Extraocular Movements: Extraocular movements intact.     Conjunctiva/sclera: Conjunctivae normal.     Pupils: Pupils are equal, round, and reactive to light.  Cardiovascular:     Rate and Rhythm: Normal rate and regular rhythm.     Pulses: Normal pulses.     Heart sounds: Normal heart sounds.  Pulmonary:     Effort: Pulmonary effort is normal.     Breath sounds: Normal breath sounds.  Abdominal:     General: Abdomen is  flat. Bowel sounds are normal.     Palpations: Abdomen is soft. There is no mass.     Tenderness: There is no abdominal tenderness. There is no right CVA tenderness, left CVA tenderness, guarding or rebound.  Musculoskeletal:        General: Normal range of motion.     Cervical back: Normal range of motion and neck supple.     Right lower leg: No edema.     Left lower leg: No edema.  Skin:    General: Skin is warm.     Findings: No lesion.  Neurological:     General: No focal deficit present.     Mental Status: He is alert and oriented to person, place, and time.     Cranial Nerves: No cranial nerve deficit.     Motor: No weakness.     Gait: Gait normal.  Psychiatric:        Mood and Affect: Mood normal.        Behavior: Behavior normal.        Assessment & Plan:  Type 2 diabetes mellitus with other specified complication, unspecified whether long term insulin use (HCC) -     POCT glucose (manual entry) -     POCT Urinalysis Dipstick (Automated)  Nausea and vomiting, unspecified vomiting type -     POCT Urinalysis Dipstick (Automated) -     Ondansetron HCl; Take 1 tablet (8 mg total) by mouth every 8 (eight) hours as needed for nausea or vomiting.  Dispense: 20 tablet; Refill: 0  Fever, unspecified fever cause -     POCT Urinalysis Dipstick (Automated)  Dark brown-colored urine    Patient afebrile, but very diaphoretic on exam. He does not have an overt complaints of pain today, but still feels somewhat nauseated. POC glucose was 106, fasting. Planned to check STAT labs and POC urine. Our lab tech brought his urine back to me and it was noted to be tea-colored / brown in color. At this time, I did feel it best to have him proceed to [facility] ED for STAT eval. He is stable on discharge. He felt comfortable to drive himself.      M , PA-C

## 2022-08-26 NOTE — ED Notes (Signed)
Pt aware of the need for a urine... Unable to currently collect the urine... 

## 2022-08-26 NOTE — Discharge Instructions (Signed)
Follow-up with your primary doctor.  Return if develop fevers, chills, worsening abdominal pain, nausea vomiting.  Stop urinating or develop any new or worsening symptoms that are concerning to you.

## 2022-08-26 NOTE — ED Triage Notes (Signed)
Vomiting Friday, nauseated all weekend,generally feeling unwell. Fevers all weekend. Went to primary today. At primary, pt was told that his urine was dark and to go to [facility] ER.

## 2022-08-28 ENCOUNTER — Encounter: Payer: Self-pay | Admitting: Physician Assistant

## 2022-08-28 NOTE — Telephone Encounter (Signed)
Please see pt message and advise 

## 2022-08-29 ENCOUNTER — Ambulatory Visit (HOSPITAL_COMMUNITY)
Admission: RE | Admit: 2022-08-29 | Discharge: 2022-08-29 | Disposition: A | Discharge status: Home / Self Care | Payer: Managed Care, Other (non HMO) | Source: Ambulatory Visit | Attending: Physician Assistant | Admitting: Physician Assistant

## 2022-08-29 ENCOUNTER — Ambulatory Visit (INDEPENDENT_AMBULATORY_CARE_PROVIDER_SITE_OTHER): Payer: Managed Care, Other (non HMO) | Admitting: Physician Assistant

## 2022-08-29 ENCOUNTER — Encounter: Payer: Self-pay | Admitting: Physician Assistant

## 2022-08-29 VITALS — BP 120/74 | HR 72 | Temp 97.7°F | Ht 75.0 in | Wt 301.5 lb

## 2022-08-29 DIAGNOSIS — R82998 Other abnormal findings in urine: Secondary | ICD-10-CM | POA: Diagnosis not present

## 2022-08-29 DIAGNOSIS — R509 Fever, unspecified: Secondary | ICD-10-CM

## 2022-08-29 DIAGNOSIS — R1011 Right upper quadrant pain: Secondary | ICD-10-CM | POA: Insufficient documentation

## 2022-08-29 LAB — POC URINALSYSI DIPSTICK (AUTOMATED)
Bilirubin, UA: NEGATIVE
Blood, UA: NEGATIVE
Glucose, UA: NEGATIVE
Ketones, UA: NEGATIVE
Leukocytes, UA: NEGATIVE
Nitrite, UA: NEGATIVE
Protein, UA: NEGATIVE
Spec Grav, UA: 1.015 (ref 1.010–1.025)
Urobilinogen, UA: 0.2 E.U./dL
pH, UA: 6 (ref 5.0–8.0)

## 2022-08-29 LAB — COMPREHENSIVE METABOLIC PANEL
ALT: 31 U/L (ref 0–53)
AST: 18 U/L (ref 0–37)
Albumin: 4.2 g/dL (ref 3.5–5.2)
Alkaline Phosphatase: 89 U/L (ref 39–117)
BUN: 11 mg/dL (ref 6–23)
CO2: 29 mEq/L (ref 19–32)
Calcium: 9.3 mg/dL (ref 8.4–10.5)
Chloride: 96 mEq/L (ref 96–112)
Creatinine, Ser: 1.03 mg/dL (ref 0.40–1.50)
GFR: 90.1 mL/min (ref >60.00)
Glucose, Bld: 109 mg/dL — ABNORMAL HIGH (ref 70–99)
Potassium: 3.9 mEq/L (ref 3.5–5.1)
Sodium: 132 mEq/L — ABNORMAL LOW (ref 135–145)
Total Bilirubin: 0.4 mg/dL (ref 0.2–1.2)
Total Protein: 7.8 g/dL (ref 6.0–8.3)

## 2022-08-29 LAB — CBC WITH DIFFERENTIAL/PLATELET
Basophils Absolute: 0 10*3/uL (ref 0.0–0.1)
Basophils Relative: 0.4 % (ref 0.0–3.0)
Eosinophils Absolute: 0.2 10*3/uL (ref 0.0–0.7)
Eosinophils Relative: 2 % (ref 0.0–5.0)
HCT: 45.9 % (ref 39.0–52.0)
Hemoglobin: 15.1 g/dL (ref 13.0–17.0)
Lymphocytes Relative: 21.8 % (ref 12.0–46.0)
Lymphs Abs: 2.4 10*3/uL (ref 0.7–4.0)
MCHC: 32.8 g/dL (ref 30.0–36.0)
MCV: 89.7 fl (ref 78.0–100.0)
Monocytes Absolute: 0.6 10*3/uL (ref 0.1–1.0)
Monocytes Relative: 5.5 % (ref 3.0–12.0)
Neutro Abs: 7.8 10*3/uL — ABNORMAL HIGH (ref 1.4–7.7)
Neutrophils Relative %: 70.3 % (ref 43.0–77.0)
Platelets: 367 10*3/uL (ref 150.0–400.0)
RBC: 5.12 Mil/uL (ref 4.22–5.81)
RDW: 13.6 % (ref 11.5–15.5)
WBC: 11.1 10*3/uL — ABNORMAL HIGH (ref 4.0–10.5)

## 2022-08-29 LAB — AMYLASE: Amylase: 26 U/L — ABNORMAL LOW (ref 27–131)

## 2022-08-29 LAB — LIPASE: Lipase: 16 U/L (ref 11.0–59.0)

## 2022-08-29 LAB — CK: Total CK: 235 U/L — ABNORMAL HIGH (ref 7–232)

## 2022-08-29 MED ORDER — IOHEXOL 300 MG/ML  SOLN
100.0000 mL | Freq: Once | INTRAMUSCULAR | Status: AC | PRN
  Administered 2022-08-29: 100 mL via INTRAVENOUS

## 2022-08-29 MED ORDER — IOHEXOL 9 MG/ML PO SOLN
1000.0000 mL | ORAL | Status: AC
  Administered 2022-08-29: 1000 mL via ORAL

## 2022-08-29 MED ORDER — SODIUM CHLORIDE (PF) 0.9 % IJ SOLN
INTRAMUSCULAR | Status: AC
  Filled 2022-08-29: qty 50

## 2022-08-29 MED ORDER — IOHEXOL 9 MG/ML PO SOLN
ORAL | Status: AC
  Filled 2022-08-29: qty 1000

## 2022-08-29 NOTE — Telephone Encounter (Signed)
Gave info to front desk and advised patient needs to come into the office to be seen today for vitals, status etc.

## 2022-08-29 NOTE — Progress Notes (Signed)
Subjective:    Patient ID: Kyle [name], male    DOB: 09-28-1980, 42 y.o.   MRN: [id number]  Chief Complaint  Patient presents with   Results    Pt here to discuss lab results.   Abdominal Pain    Pt c/o RUQ abdominal pain.    HPI Patient is in today for recheck from 08/26/22 office and ER visit.   Felt great yesterday morning, then around 11:30 am felt RUQ internal pain "organ pain" again slowly building pain,  Tylenol/ ibuprofen dulled it within an hour, lasted most of the day as a dull pain;  subsided at this time - bearable, but still noticeable at times No nausea or vomiting Fever at 3 am this morning 99.7 F - had more antipyretic at this time Burping more today Loose stools yesterday  No blood in urine or stool    HPI from my last visit: Pt is in today for acute sickness.  Started 08/22/22 - nausea, vomiting, epigastric discomfort, fever; feels like he injured rib in that time with forceful vomiting; Ice pack, ibuprofen / Tylenol - last took this morning.    Today - still some nausea, loss of appetite; fever has still been off and on; diaphoretic starting this morning.    No close sick contacts. No recent travel. No new foods or restaurants.    No abdominal pain, chest pain, SOB. No coughing. No dizziness or headaches. No diarrhea or stool changes. Last BM this morning, normal. Urine is yellow, sometimes darker than others.  States he is feeling somewhat better today.   Of note, also T2DM - taking Mounjaro, last took 5 mg dose on Sunday 08/24/22. (Started Mounjaro end of April).    Has not had anything to eat or drink yet today.    Past Medical History:  Diagnosis Date   Diabetes mellitus without complication (HCC)    Hypertension    Lumbar herniated disc    Pilonidal disease    Psoriasis    Sciatica 2017   Left   Wears contact lenses    Wears glasses     Past Surgical History:  Procedure Laterality Date   ABCESS DRAINAGE     Pilonidal cyst   LUMBAR  LAMINECTOMY/DECOMPRESSION MICRODISCECTOMY Left 04/08/2018   Procedure: Left L5-S1 discectomy;  Surgeon: Venita Lick, MD;  Location: [facility] OR;  Service: Orthopedics;  Laterality: Left;  120 mins   PILONIDAL CYST EXCISION N/A 12/04/2017   Procedure: EXCISION OF PILONIDAL DISEASE;  Surgeon: Karie Soda, MD;  Location: [facility];  Service: General;  Laterality: N/A;   WISDOM TOOTH EXTRACTION      Family History  Problem Relation Age of Onset   Hypertension Father    Diabetes Father    Inflammatory bowel disease Sister     Social History   Tobacco Use   Smoking status: Never   Smokeless tobacco: Never   Tobacco comments:    once monthly  Vaping Use   Vaping status: Never Used  Substance Use Topics   Alcohol use: Not Currently   Drug use: No     No Known Allergies  Review of Systems NEGATIVE UNLESS OTHERWISE INDICATED IN HPI      Objective:     BP 120/74 (BP Location: Left Arm, Patient Position: Sitting, Cuff Size: Large)   Pulse 72   Temp 97.7 F (36.5 C) (Temporal)   Ht 6\' 3"  (1.905 m)   Wt (!) 301 lb 8 oz (136.8 kg)  SpO2 98%   BMI 37.68 kg/m   Wt Readings from Last 3 Encounters:  08/29/22 (!) 301 lb 8 oz (136.8 kg)  08/26/22 295 lb 12.8 oz (134.2 kg)  08/12/22 (!) 304 lb (137.9 kg)    BP Readings from Last 3 Encounters:  08/29/22 120/74  08/26/22 (!) 132/97  08/26/22 122/88     Physical Exam Vitals and nursing note reviewed.  Constitutional:      General: He is not in acute distress.    Appearance: Normal appearance. He is not ill-appearing, toxic-appearing or diaphoretic.  HENT:     Head: Normocephalic and atraumatic.     Right Ear: Tympanic membrane, ear canal and external ear normal.     Left Ear: Tympanic membrane, ear canal and external ear normal.     Nose: Nose normal.     Mouth/Throat:     Mouth: Mucous membranes are moist.     Pharynx: Oropharynx is clear.  Eyes:     Extraocular Movements: Extraocular movements  intact.     Conjunctiva/sclera: Conjunctivae normal.     Pupils: Pupils are equal, round, and reactive to light.  Cardiovascular:     Rate and Rhythm: Normal rate and regular rhythm.     Pulses: Normal pulses.     Heart sounds: Normal heart sounds.  Pulmonary:     Effort: Pulmonary effort is normal.     Breath sounds: Normal breath sounds.  Abdominal:     General: Abdomen is flat. Bowel sounds are normal.     Palpations: Abdomen is soft. There is no mass.     Tenderness: There is abdominal tenderness in the right upper quadrant. There is no right CVA tenderness, left CVA tenderness, guarding or rebound.  Musculoskeletal:        General: Normal range of motion.     Cervical back: Normal range of motion and neck supple.     Right lower leg: No edema.     Left lower leg: No edema.  Skin:    General: Skin is warm.     Findings: No lesion.  Neurological:     General: No focal deficit present.     Mental Status: He is alert and oriented to person, place, and time.     Cranial Nerves: No cranial nerve deficit.     Motor: No weakness.     Gait: Gait normal.  Psychiatric:        Mood and Affect: Mood normal.        Behavior: Behavior normal.        Assessment & Plan:  RUQ abdominal pain -     CT ABDOMEN PELVIS W CONTRAST; Future -     CBC with Differential/Platelet -     Comprehensive metabolic panel -     Lipase -     CK -     Amylase  Fever and chills -     CT ABDOMEN PELVIS W CONTRAST; Future -     POCT Urinalysis Dipstick (Automated) -     CBC with Differential/Platelet -     Comprehensive metabolic panel -     Lipase -     CK -     Amylase  Dark brown-colored urine -     POCT Urinalysis Dipstick (Automated) -     CK -     Amylase    I personally reviewed patient's ER visit and labs. No imaging done at that time. WBC count was slightly elevated. Today, better appearance  overall, not clammy or ill-appearing today, but concerns of ongoing RUQ intermittent pain  and fevers. UA clear today, so obviously has been doing good with fluids.  Will plan to check CT Abd/ pelvis and recheck labs today. Need to r/o possible GBD, pancreatitis, colitis, etc.  ER precautions discussed.     Thanh Pomerleau M Ugochukwu Chichester, PA-C

## 2022-08-29 NOTE — Patient Instructions (Addendum)
Kyle [name] Long main entrance now and remain NPO (nothing by mouth) until after the scan

## 2022-08-30 ENCOUNTER — Emergency Department (HOSPITAL_BASED_OUTPATIENT_CLINIC_OR_DEPARTMENT_OTHER): Payer: Managed Care, Other (non HMO) | Admitting: Anesthesiology

## 2022-08-30 ENCOUNTER — Encounter (HOSPITAL_COMMUNITY): Payer: Self-pay

## 2022-08-30 ENCOUNTER — Emergency Department (HOSPITAL_COMMUNITY): Payer: Managed Care, Other (non HMO) | Admitting: Anesthesiology

## 2022-08-30 ENCOUNTER — Observation Stay (HOSPITAL_COMMUNITY)
Admission: EM | Admit: 2022-08-30 | Discharge: 2022-08-31 | Disposition: A | Discharge status: Home / Self Care | Payer: Managed Care, Other (non HMO) | Attending: General Surgery | Admitting: General Surgery

## 2022-08-30 ENCOUNTER — Other Ambulatory Visit: Payer: Self-pay

## 2022-08-30 ENCOUNTER — Encounter (HOSPITAL_COMMUNITY): Admission: EM | Disposition: A | Discharge status: Home / Self Care | Payer: Self-pay | Source: Home / Self Care

## 2022-08-30 DIAGNOSIS — E119 Type 2 diabetes mellitus without complications: Secondary | ICD-10-CM | POA: Diagnosis not present

## 2022-08-30 DIAGNOSIS — K811 Chronic cholecystitis: Secondary | ICD-10-CM | POA: Diagnosis not present

## 2022-08-30 DIAGNOSIS — K819 Cholecystitis, unspecified: Principal | ICD-10-CM

## 2022-08-30 DIAGNOSIS — Z79899 Other long term (current) drug therapy: Secondary | ICD-10-CM | POA: Insufficient documentation

## 2022-08-30 DIAGNOSIS — I10 Essential (primary) hypertension: Secondary | ICD-10-CM | POA: Diagnosis not present

## 2022-08-30 DIAGNOSIS — Z9889 Other specified postprocedural states: Secondary | ICD-10-CM

## 2022-08-30 DIAGNOSIS — K810 Acute cholecystitis: Secondary | ICD-10-CM | POA: Diagnosis not present

## 2022-08-30 DIAGNOSIS — R109 Unspecified abdominal pain: Secondary | ICD-10-CM | POA: Diagnosis present

## 2022-08-30 HISTORY — PX: CHOLECYSTECTOMY: SHX55

## 2022-08-30 LAB — URINALYSIS, ROUTINE W REFLEX MICROSCOPIC
Bilirubin Urine: NEGATIVE
Glucose, UA: NEGATIVE mg/dL
Hgb urine dipstick: NEGATIVE
Ketones, ur: NEGATIVE mg/dL
Leukocytes,Ua: NEGATIVE
Nitrite: NEGATIVE
Protein, ur: NEGATIVE mg/dL
Specific Gravity, Urine: 1.019 (ref 1.005–1.030)
pH: 5 (ref 5.0–8.0)

## 2022-08-30 LAB — CBC WITH DIFFERENTIAL/PLATELET
Abs Immature Granulocytes: 0.04 10*3/uL (ref 0.00–0.07)
Basophils Absolute: 0.1 10*3/uL (ref 0.0–0.1)
Basophils Relative: 1 %
Eosinophils Absolute: 0.2 10*3/uL (ref 0.0–0.5)
Eosinophils Relative: 2 %
HCT: 48.8 % (ref 39.0–52.0)
Hemoglobin: 15.8 g/dL (ref 13.0–17.0)
Immature Granulocytes: 0 %
Lymphocytes Relative: 24 %
Lymphs Abs: 2.4 10*3/uL (ref 0.7–4.0)
MCH: 29 pg (ref 26.0–34.0)
MCHC: 32.4 g/dL (ref 30.0–36.0)
MCV: 89.7 fL (ref 80.0–100.0)
Monocytes Absolute: 0.6 10*3/uL (ref 0.1–1.0)
Monocytes Relative: 6 %
Neutro Abs: 6.7 10*3/uL (ref 1.7–7.7)
Neutrophils Relative %: 67 %
Platelets: 408 10*3/uL — ABNORMAL HIGH (ref 150–400)
RBC: 5.44 MIL/uL (ref 4.22–5.81)
RDW: 12.9 % (ref 11.5–15.5)
WBC: 9.9 10*3/uL (ref 4.0–10.5)
nRBC: 0 % (ref 0.0–0.2)

## 2022-08-30 LAB — COMPREHENSIVE METABOLIC PANEL
ALT: 33 U/L (ref 0–44)
AST: 21 U/L (ref 15–41)
Albumin: 3.7 g/dL (ref 3.5–5.0)
Alkaline Phosphatase: 87 U/L (ref 38–126)
Anion gap: 11 (ref 5–15)
BUN: 8 mg/dL (ref 6–20)
CO2: 25 mmol/L (ref 22–32)
Calcium: 8.9 mg/dL (ref 8.9–10.3)
Chloride: 101 mmol/L (ref 98–111)
Creatinine, Ser: 1.2 mg/dL (ref 0.61–1.24)
GFR, Estimated: >60 mL/min (ref >60)
Glucose, Bld: 125 mg/dL — ABNORMAL HIGH (ref 70–99)
Potassium: 4.1 mmol/L (ref 3.5–5.1)
Sodium: 137 mmol/L (ref 135–145)
Total Bilirubin: 0.5 mg/dL (ref 0.3–1.2)
Total Protein: 7.9 g/dL (ref 6.5–8.1)

## 2022-08-30 LAB — CBC
HCT: 46.1 % (ref 39.0–52.0)
Hemoglobin: 15.5 g/dL (ref 13.0–17.0)
MCH: 30 pg (ref 26.0–34.0)
MCHC: 33.6 g/dL (ref 30.0–36.0)
MCV: 89.2 fL (ref 80.0–100.0)
Platelets: 396 10*3/uL (ref 150–400)
RBC: 5.17 MIL/uL (ref 4.22–5.81)
RDW: 13 % (ref 11.5–15.5)
WBC: 13.3 10*3/uL — ABNORMAL HIGH (ref 4.0–10.5)
nRBC: 0 % (ref 0.0–0.2)

## 2022-08-30 LAB — CBG MONITORING, ED: Glucose-Capillary: 75 mg/dL (ref 70–99)

## 2022-08-30 LAB — GLUCOSE, CAPILLARY
Glucose-Capillary: 75 mg/dL (ref 70–99)
Glucose-Capillary: 135 mg/dL — ABNORMAL HIGH (ref 70–99)

## 2022-08-30 LAB — LIPASE, BLOOD: Lipase: 27 U/L (ref 11–51)

## 2022-08-30 SURGERY — LAPAROSCOPIC CHOLECYSTECTOMY
Anesthesia: General

## 2022-08-30 MED ORDER — LACTATED RINGERS IV SOLN: INTRAVENOUS | Status: DC

## 2022-08-30 MED ORDER — ACETAMINOPHEN 10 MG/ML IV SOLN
INTRAVENOUS | Status: DC | PRN
  Administered 2022-08-30: 1000 mg via INTRAVENOUS

## 2022-08-30 MED ORDER — FENTANYL CITRATE (PF) 250 MCG/5ML IJ SOLN
INTRAMUSCULAR | Status: DC | PRN
  Administered 2022-08-30: 100 ug via INTRAVENOUS
  Administered 2022-08-30 (×4): 50 ug via INTRAVENOUS

## 2022-08-30 MED ORDER — CHLORHEXIDINE GLUCONATE 0.12 % MT SOLN: 15.0000 mL | Freq: Once | OROMUCOSAL | Status: AC

## 2022-08-30 MED ORDER — SODIUM CHLORIDE 0.9 % IR SOLN
Status: DC | PRN
  Administered 2022-08-30: 1000 mL

## 2022-08-30 MED ORDER — HYDROMORPHONE HCL 1 MG/ML IJ SOLN: 0.5000 mg | INTRAMUSCULAR | Status: DC | PRN

## 2022-08-30 MED ORDER — DEXAMETHASONE SODIUM PHOSPHATE 10 MG/ML IJ SOLN
INTRAMUSCULAR | Status: DC | PRN
  Administered 2022-08-30: 10 mg via INTRAVENOUS

## 2022-08-30 MED ORDER — PIPERACILLIN-TAZOBACTAM 3.375 G IVPB
3.3750 g | Freq: Three times a day (TID) | INTRAVENOUS | Status: DC
  Administered 2022-08-30 – 2022-08-31 (×2): 3.375 g via INTRAVENOUS
  Filled 2022-08-30 (×3): qty 50

## 2022-08-30 MED ORDER — FENTANYL CITRATE (PF) 100 MCG/2ML IJ SOLN
25.0000 ug | INTRAMUSCULAR | Status: DC | PRN
  Administered 2022-08-30: 50 ug via INTRAVENOUS

## 2022-08-30 MED ORDER — METHOCARBAMOL 1000 MG/10ML IJ SOLN: 500.0000 mg | Freq: Four times a day (QID) | INTRAVENOUS | Status: DC | PRN

## 2022-08-30 MED ORDER — PROPOFOL 10 MG/ML IV BOLUS
INTRAVENOUS | Status: DC | PRN
  Administered 2022-08-30: 200 mg via INTRAVENOUS

## 2022-08-30 MED ORDER — AMISULPRIDE (ANTIEMETIC) 5 MG/2ML IV SOLN
10.0000 mg | Freq: Once | INTRAVENOUS | Status: AC | PRN
  Administered 2022-08-30: 10 mg via INTRAVENOUS

## 2022-08-30 MED ORDER — DOCUSATE SODIUM 100 MG PO CAPS
100.0000 mg | ORAL_CAPSULE | Freq: Two times a day (BID) | ORAL | Status: DC
  Administered 2022-08-31 (×2): 100 mg via ORAL
  Filled 2022-08-30 (×2): qty 1

## 2022-08-30 MED ORDER — PROCHLORPERAZINE EDISYLATE 10 MG/2ML IJ SOLN: 10.0000 mg | INTRAMUSCULAR | Status: DC | PRN

## 2022-08-30 MED ORDER — FENTANYL CITRATE (PF) 250 MCG/5ML IJ SOLN
INTRAMUSCULAR | Status: AC
  Filled 2022-08-30: qty 5

## 2022-08-30 MED ORDER — LIDOCAINE 2% (20 MG/ML) 5 ML SYRINGE
INTRAMUSCULAR | Status: DC | PRN
  Administered 2022-08-30: 100 mg via INTRAVENOUS

## 2022-08-30 MED ORDER — OXYCODONE HCL 5 MG PO TABS: 10.0000 mg | ORAL_TABLET | ORAL | Status: DC | PRN

## 2022-08-30 MED ORDER — OXYCODONE-ACETAMINOPHEN 5-325 MG PO TABS
1.0000 | ORAL_TABLET | ORAL | 0 refills | Status: DC | PRN
  Filled 2022-08-30: qty 20, 4d supply, fill #0

## 2022-08-30 MED ORDER — LACTATED RINGERS IV SOLN
INTRAVENOUS | Status: DC | PRN
Start: 2022-08-30 — End: 2022-08-30

## 2022-08-30 MED ORDER — FENTANYL CITRATE PF 50 MCG/ML IJ SOSY
50.0000 ug | PREFILLED_SYRINGE | INTRAMUSCULAR | Status: DC | PRN
  Filled 2022-08-30: qty 1

## 2022-08-30 MED ORDER — ENOXAPARIN SODIUM 40 MG/0.4ML IJ SOSY
40.0000 mg | PREFILLED_SYRINGE | INTRAMUSCULAR | Status: DC
  Administered 2022-08-31: 40 mg via SUBCUTANEOUS
  Filled 2022-08-30: qty 0.4

## 2022-08-30 MED ORDER — ONDANSETRON HCL 4 MG/2ML IJ SOLN: 4.0000 mg | INTRAMUSCULAR | Status: DC | PRN

## 2022-08-30 MED ORDER — ONDANSETRON HCL 4 MG/2ML IJ SOLN: 4.0000 mg | Freq: Four times a day (QID) | INTRAMUSCULAR | Status: DC | PRN

## 2022-08-30 MED ORDER — ONDANSETRON HCL 4 MG/2ML IJ SOLN
INTRAMUSCULAR | Status: DC | PRN
  Administered 2022-08-30: 4 mg via INTRAVENOUS

## 2022-08-30 MED ORDER — GABAPENTIN 600 MG PO TABS
300.0000 mg | ORAL_TABLET | Freq: Three times a day (TID) | ORAL | Status: DC
Start: 2022-08-30 — End: 2022-08-31

## 2022-08-30 MED ORDER — CHLORHEXIDINE GLUCONATE 0.12 % MT SOLN
OROMUCOSAL | Status: AC
  Administered 2022-08-30: 15 mL via OROMUCOSAL
  Filled 2022-08-30: qty 15

## 2022-08-30 MED ORDER — ORAL CARE MOUTH RINSE: 15.0000 mL | Freq: Once | OROMUCOSAL | Status: AC

## 2022-08-30 MED ORDER — AMISULPRIDE (ANTIEMETIC) 5 MG/2ML IV SOLN
INTRAVENOUS | Status: AC
  Filled 2022-08-30: qty 4

## 2022-08-30 MED ORDER — OXYCODONE HCL 5 MG PO TABS
5.0000 mg | ORAL_TABLET | ORAL | Status: DC | PRN
  Administered 2022-08-31: 5 mg via ORAL
  Filled 2022-08-30: qty 1

## 2022-08-30 MED ORDER — BUPIVACAINE HCL (PF) 0.25 % IJ SOLN
INTRAMUSCULAR | Status: AC
  Filled 2022-08-30: qty 30

## 2022-08-30 MED ORDER — ACETAMINOPHEN 10 MG/ML IV SOLN
INTRAVENOUS | Status: AC
  Filled 2022-08-30: qty 100

## 2022-08-30 MED ORDER — PROPOFOL 10 MG/ML IV BOLUS
INTRAVENOUS | Status: AC
  Filled 2022-08-30: qty 20

## 2022-08-30 MED ORDER — BUPIVACAINE HCL 0.25 % IJ SOLN
INTRAMUSCULAR | Status: DC | PRN
  Administered 2022-08-30: 30 mL

## 2022-08-30 MED ORDER — ROCURONIUM BROMIDE 10 MG/ML (PF) SYRINGE
PREFILLED_SYRINGE | INTRAVENOUS | Status: DC | PRN
  Administered 2022-08-30: 10 mg via INTRAVENOUS
  Administered 2022-08-30: 70 mg via INTRAVENOUS

## 2022-08-30 MED ORDER — MIDAZOLAM HCL 2 MG/2ML IJ SOLN
INTRAMUSCULAR | Status: AC
  Filled 2022-08-30: qty 2

## 2022-08-30 MED ORDER — SIMETHICONE 80 MG PO CHEW: 80.0000 mg | CHEWABLE_TABLET | Freq: Four times a day (QID) | ORAL | Status: DC | PRN

## 2022-08-30 MED ORDER — FENTANYL CITRATE (PF) 100 MCG/2ML IJ SOLN
INTRAMUSCULAR | Status: AC
  Filled 2022-08-30: qty 2

## 2022-08-30 MED ORDER — CEFAZOLIN SODIUM-DEXTROSE 2-4 GM/100ML-% IV SOLN
2.0000 g | Freq: Three times a day (TID) | INTRAVENOUS | Status: DC
  Administered 2022-08-30 – 2022-08-31 (×3): 2 g via INTRAVENOUS
  Filled 2022-08-30 (×3): qty 100

## 2022-08-30 MED ORDER — ONDANSETRON HCL 4 MG/2ML IJ SOLN: 4.0000 mg | Freq: Once | INTRAMUSCULAR | Status: DC | PRN

## 2022-08-30 MED ORDER — ACETAMINOPHEN 325 MG PO TABS
650.0000 mg | ORAL_TABLET | Freq: Four times a day (QID) | ORAL | Status: DC
  Administered 2022-08-31: 650 mg via ORAL
  Filled 2022-08-30 (×3): qty 2

## 2022-08-30 MED ORDER — MIDAZOLAM HCL 2 MG/2ML IJ SOLN
INTRAMUSCULAR | Status: DC | PRN
  Administered 2022-08-30: 2 mg via INTRAVENOUS

## 2022-08-30 MED ORDER — SUGAMMADEX SODIUM 200 MG/2ML IV SOLN
INTRAVENOUS | Status: DC | PRN
  Administered 2022-08-30: 300 mg via INTRAVENOUS

## 2022-08-30 MED ORDER — CEFAZOLIN SODIUM-DEXTROSE 2-4 GM/100ML-% IV SOLN
INTRAVENOUS | Status: AC
  Filled 2022-08-30: qty 100

## 2022-08-30 SURGICAL SUPPLY — 51 items
ADH SKN CLS APL DERMABOND .7 (GAUZE/BANDAGES/DRESSINGS) ×1
APL PRP STRL LF DISP 70% ISPRP (MISCELLANEOUS) ×1
APPLIER CLIP ROT 10 11.4 M/L (STAPLE) ×1
APR CLP MED LRG 11.4X10 (STAPLE) ×1
BAG COUNTER SPONGE SURGICOUNT (BAG) ×1 IMPLANT
BAG SPEC RTRVL 10 TROC 200 (ENDOMECHANICALS) ×1
BAG SPNG CNTER NS LX DISP (BAG) ×1
CANISTER SUCT 3000ML PPV (MISCELLANEOUS) ×1 IMPLANT
CHLORAPREP W/TINT 26 (MISCELLANEOUS) ×1 IMPLANT
CLIP APPLIE ROT 10 11.4 M/L (STAPLE) ×1 IMPLANT
COVER SURGICAL LIGHT HANDLE (MISCELLANEOUS) ×1 IMPLANT
DERMABOND ADVANCED .7 DNX12 (GAUZE/BANDAGES/DRESSINGS) ×1 IMPLANT
DRAIN CHANNEL 19F RND (DRAIN) IMPLANT
DRAIN RELI 100 BL SUC LF ST (DRAIN) ×1
ELECT REM PT RETURN 9FT ADLT (ELECTROSURGICAL) ×1
ELECTRODE REM PT RTRN 9FT ADLT (ELECTROSURGICAL) ×1 IMPLANT
ENDOLOOP SUT PDS II 0 18 (SUTURE) IMPLANT
EVACUATOR SILICONE 100CC (DRAIN) IMPLANT
GAUZE SPONGE 2X2 8PLY STRL LF (GAUZE/BANDAGES/DRESSINGS) IMPLANT
GAUZE SPONGE 2X2 STRL 8-PLY (GAUZE/BANDAGES/DRESSINGS) IMPLANT
GLOVE BIO SURGEON STRL SZ7.5 (GLOVE) ×1 IMPLANT
GLOVE BIOGEL PI IND STRL 8 (GLOVE) ×1 IMPLANT
GOWN STRL REUS W/ TWL LRG LVL3 (GOWN DISPOSABLE) ×2 IMPLANT
GOWN STRL REUS W/ TWL XL LVL3 (GOWN DISPOSABLE) ×1 IMPLANT
GOWN STRL REUS W/TWL LRG LVL3 (GOWN DISPOSABLE) ×2
GOWN STRL REUS W/TWL XL LVL3 (GOWN DISPOSABLE) ×1
GRASPER SUT TROCAR 14GX15 (MISCELLANEOUS) ×1 IMPLANT
IRRIG SUCT STRYKERFLOW 2 WTIP (MISCELLANEOUS) ×1
IRRIGATION SUCT STRKRFLW 2 WTP (MISCELLANEOUS) ×1 IMPLANT
KIT BASIN OR (CUSTOM PROCEDURE TRAY) ×1 IMPLANT
KIT IMAGING PINPOINTPAQ (MISCELLANEOUS) IMPLANT
KIT TURNOVER KIT B (KITS) ×1 IMPLANT
NDL 22X1.5 STRL (OR ONLY) (MISCELLANEOUS) ×1 IMPLANT
NDL INSUFFLATION 14GA 120MM (NEEDLE) ×1 IMPLANT
NEEDLE 22X1.5 STRL (OR ONLY) (MISCELLANEOUS) ×1 IMPLANT
NEEDLE INSUFFLATION 14GA 120MM (NEEDLE) ×1 IMPLANT
NS IRRIG 1000ML POUR BTL (IV SOLUTION) ×1 IMPLANT
PAD ARMBOARD 7.5X6 YLW CONV (MISCELLANEOUS) ×1 IMPLANT
POUCH RETRIEVAL ECOSAC 10 (ENDOMECHANICALS) ×1 IMPLANT
SCISSORS LAP 5X35 DISP (ENDOMECHANICALS) ×1 IMPLANT
SET TUBE SMOKE EVAC HIGH FLOW (TUBING) ×1 IMPLANT
SLEEVE Z-THREAD 5X100MM (TROCAR) ×2 IMPLANT
SUT ETHILON 2 0 FS 18 (SUTURE) IMPLANT
SUT MNCRL AB 4-0 PS2 18 (SUTURE) ×1 IMPLANT
TOWEL GREEN STERILE (TOWEL DISPOSABLE) ×1 IMPLANT
TOWEL GREEN STERILE FF (TOWEL DISPOSABLE) ×1 IMPLANT
TRAY LAPAROSCOPIC MC (CUSTOM PROCEDURE TRAY) ×1 IMPLANT
TROCAR 11X100 Z THREAD (TROCAR) ×1 IMPLANT
TROCAR Z-THREAD OPTICAL 5X100M (TROCAR) ×1 IMPLANT
WARMER LAPAROSCOPE (MISCELLANEOUS) ×1 IMPLANT
WATER STERILE IRR 1000ML POUR (IV SOLUTION) ×1 IMPLANT

## 2022-08-30 NOTE — Discharge Instructions (Signed)
 CHOLECYSTECTOMY POST OPERATIVE INSTRUCTIONS  Thinking Clearly  The anesthesia may cause you to feel different for 1 or 2 days. Do not drive, drink alcohol, or make any big decisions for at least 2 days.  Nutrition When you wake up, you will be able to drink small amounts of liquid. If you do not feel sick, you can slowly advance your diet to regular foods. Continue to drink lots of fluids, usually about 8 to 10 glasses per day. Eat a high-fiber diet so you don't strain during bowel movements. High-Fiber Foods Foods high in fiber include beans, bran cereals and whole-grain breads, peas, dried fruit (figs, apricots, and dates), raspberries, blackberries, strawberries, sweet corn, broccoli, baked potatoes with skin, plums, pears, apples, greens, and nuts. Activity Slowly increase your activity. Be sure to get up and walk every hour or so to prevent blood clots. No heavy lifting or strenuous activity for 4 weeks following surgery to prevent hernias at your incision sites It is normal to feel tired. You may need more sleep than usual.  Get your rest but make sure to get up and move around frequently to prevent blood clots and pneumonia.  Work and Return to School You can go back to work when you feel well enough. Discuss the timing with your surgeon. You can usually go back to school or work 1 week after an operation. If your work requires heavy lifting or strenuous activity you need to be placed on light duty for 4 weeks following surgery. You can return to gym class, sports or other physical activities 4 weeks after surgery.  Wound Care Always wash your hands before and after touching near your incision site. Do not soak in a bathtub until cleared at your follow up appointment. You may take a shower 24 hours after surgery. A small amount of drainage from the incision is normal. If the drainage is thick and yellow or the site is red, you may have an infection, so call your surgeon. If you  have a drain in one of your incisions, it will be taken out in office when the drainage stops. Steri-Strips will fall off in 7 to 10 days or they will be removed during your first office visit. If you have dermabond glue covering over the incision, allow the glue to flake off on its own. Avoid wearing tight or rough clothing. It may rub your incisions and make it harder for them to heal. Protect the new skin, especially from the sun. The sun can burn and cause darker scarring. Your scar will heal in about 4 to 6 weeks and will become softer and continue to fade over the next year.  The cosmetic appearance of the incisions will improve over the course of the first year after surgery. Sensation around your incision will return in a few weeks or months.  Bowel Movements After intestinal surgery, you may have loose watery stools for several days. If watery diarrhea lasts longer than 3 days, contact your surgeon. Pain medication (narcotics) can cause constipation. Increase the fiber in your diet with high-fiber foods if you are constipated. You can take an over the counter stool softener like Colace to avoid constipation.  Additional over the counter medications can also be used if Colace isn't sufficient (for example, Milk of Magnesia or Miralax).  Pain The amount of pain is different for each person. Some people need only 1 to 3 doses of pain control medication, while others need more. Take alternating doses of tylenol   and ibuprofen around the clock for the first five days following surgery.  This will provide a baseline of pain control and help with inflammation.  Take the narcotic pain medication in addition if needed for severe pain.  Contact Your Surgeon at [phone], if you have: Pain in your right upper abdomen like a gallbladder attack. Pain that will not go away Pain that gets worse A fever of more than 101F (38.3C) Repeated vomiting Swelling, redness, bleeding, or bad-smelling  drainage from your wound site Strong abdominal pain No bowel movement or unable to pass gas for 3 days Watery diarrhea lasting longer than 3 days  Pain Control The goal of pain control is to minimize pain, keep you moving and help you heal. Your surgical team will work with you on your pain plan. Most often a combination of therapies and medications are used to control your pain. You may also be given medication (local anesthetic) at the surgical site. This may help control your pain for several days. Extreme pain puts extra stress on your body at a time when your body needs to focus on healing. Do not wait until your pain has reached a level "10" or is unbearable before telling your doctor or nurse. It is much easier to control pain before it becomes severe. Following a laparoscopic procedure, pain is sometimes felt in the shoulder. This is due to the gas inserted into your abdomen during the procedure. Moving and walking helps to decrease the gas and the right shoulder pain.  Use the guide below for ways to manage your post-operative pain. Learn more by going to facs.org/safepaincontrol.  How Intense Is My Pain Common Therapies to Feel Better       I hardly notice my pain, and it does not interfere with my activities.  I notice my pain and it distracts me, but I can still do activities (sitting up, walking, standing).  Non-Medication Therapies  Ice (in a bag, applied over clothing at the surgical site), elevation, rest, meditation, massage, distraction (music, TV, play) walking and mild exercise Splinting the abdomen with pillows +  Non-Opioid Medications Acetaminophen (Tylenol) Non-steroidal anti-inflammatory drugs (NSAIDS) Aspirin, Ibuprofen (Motrin, Advil) Naproxen (Aleve) Take these as needed, when you feel pain. Both acetaminophen and NSAIDs help to decrease pain and swelling (inflammation).      My pain is hard to ignore and is more noticeable even when I rest.  My  pain interferes with my usual activities.  Non-Medication Therapies  +  Non-Opioid medications  Take on a regular schedule (around-the-clock) instead of as needed. (For example, Tylenol every 6 hours at 9:00 am, 3:00 pm, 9:00 pm, 3:00 am and Motrin every 6 hours at 12:00 am, 6:00 am, 12:00 pm, 6:00 pm)         I am focused on my pain, and I am not doing my daily activities.  I am groaning in pain, and I cannot sleep. I am unable to do anything.  My pain is as bad as it could be, and nothing else matters.  Non-Medication Therapies  +  Around-the-Clock Non-Opioid Medications  +  Short-acting opioids  Opioids should be used with other medications to manage severe pain. Opioids block pain and give a feeling of euphoria (feel high). Addiction, a serious side effect of opioids, is rare with short-term (a few days) use.  Examples of short-acting opioids include: Tramadol (Ultram), Hydrocodone (Norco, Vicodin), Hydromorphone (Dilaudid), Oxycodone (Oxycontin)     The above directions have been adapted from   the [employer] Surgical Patient Education Program.  Please refer to the ACS website if needed: https://www.facs.org/-/media/files/education/patient-ed/cholesys.ashx.   Paul Stechschulte, MD [facility] [address], [address], [place], [place]  [address] ?  [address] [address], [place], [place]   [address] [phone] ? [phone] ? FAX [phone] Web site: [email or web address]  

## 2022-08-30 NOTE — H&P (Signed)
Admitting Physician: Hyman Hopes Jentry Warnell  Service: General Surgery  CC: Abdominal pain  Subjective   HPI: Kyle [name] is an 42 y.o. male who is here for abdominal pain.  Ongoing for one week.  Outpatient Ct was concerning for acute cholecystitis so his PCP recommended presenting to the ER for evaluation for cholecystectomy.  Past Medical History:  Diagnosis Date   Diabetes mellitus without complication (HCC)    Hypertension    Lumbar herniated disc    Pilonidal disease    Psoriasis    Sciatica 2017   Left   Wears contact lenses    Wears glasses     Past Surgical History:  Procedure Laterality Date   ABCESS DRAINAGE     Pilonidal cyst   LUMBAR LAMINECTOMY/DECOMPRESSION MICRODISCECTOMY Left 04/08/2018   Procedure: Left L5-S1 discectomy;  Surgeon: Venita Lick, MD;  Location: [facility] OR;  Service: Orthopedics;  Laterality: Left;  120 mins   PILONIDAL CYST EXCISION N/A 12/04/2017   Procedure: EXCISION OF PILONIDAL DISEASE;  Surgeon: Karie Soda, MD;  Location: [facility] [place];  Service: General;  Laterality: N/A;   WISDOM TOOTH EXTRACTION      Family History  Problem Relation Age of Onset   Hypertension Father    Diabetes Father    Inflammatory bowel disease Sister     Social:  reports that he has never smoked. He has never used smokeless tobacco. He reports that he does not currently use alcohol. He reports that he does not use drugs.  Allergies: No Known Allergies  Medications: Current Outpatient Medications  Medication Instructions   atorvastatin (LIPITOR) 10 mg, Oral, Daily   lisinopril (ZESTRIL) 10 mg, Oral, Every morning   Mounjaro 7.5 mg, Subcutaneous, Weekly   ondansetron (ZOFRAN) 8 mg, Oral, Every 8 hours PRN    ROS - all of the below systems have been reviewed with the patient and positives are indicated with bold text General: chills, fever or night sweats Eyes: blurry vision or double vision ENT: epistaxis or sore  throat Allergy/Immunology: itchy/watery eyes or nasal congestion Hematologic/Lymphatic: bleeding problems, blood clots or swollen lymph nodes Endocrine: temperature intolerance or unexpected weight changes Breast: new or changing breast lumps or nipple discharge Resp: cough, shortness of breath, or wheezing CV: chest pain or dyspnea on exertion GI: as per HPI GU: dysuria, trouble voiding, or hematuria MSK: joint pain or joint stiffness Neuro: TIA or stroke symptoms Derm: pruritus and skin lesion changes Psych: anxiety and depression  Objective   PE Blood pressure (!) 132/90, pulse 75, temperature 98.2 F (36.8 C), temperature source Oral, resp. rate 16, height 6\' 3"  (1.905 m), weight (!) 136.5 kg, SpO2 97%. Constitutional: NAD; conversant; no deformities Eyes: Moist conjunctiva; no lid lag; anicteric; PERRL Neck: Trachea midline; no thyromegaly Lungs: Normal respiratory effort; no tactile fremitus CV: RRR; no palpable thrills; no pitting edema GI: Abd Soft, tender RUQ; no palpable hepatosplenomegaly MSK: Normal range of motion of extremities; no clubbing/cyanosis Psychiatric: Appropriate affect; alert and oriented x3 Lymphatic: No palpable cervical or axillary lymphadenopathy  Results for orders placed or performed during the hospital encounter of 08/30/22 (from the past 24 hour(s))  Urinalysis, Routine w reflex microscopic -Urine, Clean Catch     Status: Abnormal   Collection Time: 08/30/22 12:07 PM  Result Value Ref Range   Color, Urine YELLOW YELLOW   APPearance HAZY (A) CLEAR   Specific Gravity, Urine 1.019 1.005 - 1.030   pH 5.0 5.0 - 8.0   Glucose, UA  NEGATIVE NEGATIVE mg/dL   Hgb urine dipstick NEGATIVE NEGATIVE   Bilirubin Urine NEGATIVE NEGATIVE   Ketones, ur NEGATIVE NEGATIVE mg/dL   Protein, ur NEGATIVE NEGATIVE mg/dL   Nitrite NEGATIVE NEGATIVE   Leukocytes,Ua NEGATIVE NEGATIVE  CBC with Differential     Status: Abnormal   Collection Time: 08/30/22 12:18 PM   Result Value Ref Range   WBC 9.9 4.0 - 10.5 K/uL   RBC 5.44 4.22 - 5.81 MIL/uL   Hemoglobin 15.8 13.0 - 17.0 g/dL   HCT 19.1 47.8 - 29.5 %   MCV 89.7 80.0 - 100.0 fL   MCH 29.0 26.0 - 34.0 pg   MCHC 32.4 30.0 - 36.0 g/dL   RDW 62.1 30.8 - 65.7 %   Platelets 408 (H) 150 - 400 K/uL   nRBC 0.0 0.0 - 0.2 %   Neutrophils Relative % 67 %   Neutro Abs 6.7 1.7 - 7.7 K/uL   Lymphocytes Relative 24 %   Lymphs Abs 2.4 0.7 - 4.0 K/uL   Monocytes Relative 6 %   Monocytes Absolute 0.6 0.1 - 1.0 K/uL   Eosinophils Relative 2 %   Eosinophils Absolute 0.2 0.0 - 0.5 K/uL   Basophils Relative 1 %   Basophils Absolute 0.1 0.0 - 0.1 K/uL   Immature Granulocytes 0 %   Abs Immature Granulocytes 0.04 0.00 - 0.07 K/uL  Lipase, blood     Status: None   Collection Time: 08/30/22 12:18 PM  Result Value Ref Range   Lipase 27 11 - 51 U/L  Comprehensive metabolic panel     Status: Abnormal   Collection Time: 08/30/22 12:18 PM  Result Value Ref Range   Sodium 137 135 - 145 mmol/L   Potassium 4.1 3.5 - 5.1 mmol/L   Chloride 101 98 - 111 mmol/L   CO2 25 22 - 32 mmol/L   Glucose, Bld 125 (H) 70 - 99 mg/dL   BUN 8 6 - 20 mg/dL   Creatinine, Ser 8.46 0.61 - 1.24 mg/dL   Calcium 8.9 8.9 - 96.2 mg/dL   Total Protein 7.9 6.5 - 8.1 g/dL   Albumin 3.7 3.5 - 5.0 g/dL   AST 21 15 - 41 U/L   ALT 33 0 - 44 U/L   Alkaline Phosphatase 87 38 - 126 U/L   Total Bilirubin 0.5 0.3 - 1.2 mg/dL   GFR, Estimated >95 >28 mL/min   Anion gap 11 5 - 15    Imaging Orders  No imaging studies ordered today  CT Abd/Pel 08/30/22 1. Gallbladder wall thickening with pericholecystic inflammatory changes identified. There is a 3 mm stone noted within the cystic duct. Findings are compatible with acute cholecystitis. 2. Nonobstructing left renal calculus. 3. Sigmoid diverticulosis without signs of acute diverticulitis.     Assessment and Plan   TYKEE HATCHEL is an 42 y.o. male with abdominal pain, found to have acute  cholecystitis on imaging.  I recommended laparoscopic cholecystectomy.  We discussed the procedure, its risks, benefits and alternatives. After a full discussion and all questions answered the patient granted consent to proceed.  Will proceed to OR when time is available.   Quentin Ore, MD  [facility] Use [email or web address] to contact on call provider  New Patient Billing: [id number] - High MDM

## 2022-08-30 NOTE — Anesthesia Preprocedure Evaluation (Addendum)
Anesthesia Evaluation  Patient identified by MRN, date of birth, ID band Patient awake    Reviewed: Allergy & Precautions, NPO status , Patient's Chart, lab work & pertinent test results  Airway Mallampati: III  TM Distance: >3 FB Neck ROM: Full    Dental  (+) Teeth Intact, Dental Advisory Given   Pulmonary neg pulmonary ROS   Pulmonary exam normal breath sounds clear to auscultation       Cardiovascular hypertension, Pt. on medications Normal cardiovascular exam Rhythm:Regular Rate:Normal     Neuro/Psych negative neurological ROS  negative psych ROS   GI/Hepatic negative GI ROS,,,Cholecystitis   Endo/Other  diabetes (Mounjaro), Type 2  Obesity   Renal/GU negative Renal ROS     Musculoskeletal  (+) Arthritis ,    Abdominal   Peds  Hematology negative hematology ROS (+)   Anesthesia Other Findings   Reproductive/Obstetrics                             Anesthesia Physical Anesthesia Plan  ASA: 2 and emergent  Anesthesia Plan: General   Post-op Pain Management: Ofirmev IV (intra-op)* and Toradol IV (intra-op)*   Induction: Intravenous  PONV Risk Score and Plan: 3 and Midazolam, Dexamethasone and Ondansetron  Airway Management Planned: Oral ETT and Video Laryngoscope Planned  Additional Equipment:   Intra-op Plan:   Post-operative Plan: Extubation in OR  Informed Consent: I have reviewed the patients History and Physical, chart, labs and discussed the procedure including the risks, benefits and alternatives for the proposed anesthesia with the patient or authorized representative who has indicated his/her understanding and acceptance.     Dental advisory given  Plan Discussed with: CRNA  Anesthesia Plan Comments:        Anesthesia Quick Evaluation

## 2022-08-30 NOTE — ED Provider Notes (Signed)
[place] EMERGENCY DEPARTMENT AT [facility] Provider Note  MDM   HPI/ROS:  Kyle [name] is a 42 y.o. male presenting at the request of his primary doctor after getting outpatient CT yesterday that was concerning for acute cholecystitis.  He has been experiencing right upper quadrant pain and fevers for the past week, and was evaluated by his PCP who set him up with an outpatient radiology appointment to receive a contrasted abdominal CT scan yesterday.  Patient was called after scan resulted with concerns for acute cholecystitis and 3 mm stone within the cystic duct.  Upon presentation, patient is well-appearing, afebrile, stable vital signs.  He has not eaten or drink anything in the past 6 hours in preparation for surgery.  Physical exam is notable for: - Overall well-appearing, no acute distress - Cardiopulmonary exam benign - Mild right upper quadrant tenderness to palpation  On my initial evaluation, patient is:  -Vital signs stable. Patient afebrile, hemodynamically stable, and non-toxic appearing. -Additional history obtained from chart review  Maintenance fluids initiated, patient made n.p.o., and general surgery consulted for evaluation for surgery.  Patient taken to surgery.  Please see inpatient provider notes for further details.  Disposition:  I discussed the case with general surgery who graciously agreed to admit the patient to their service for continued care.   Clinical Impression: No diagnosis found.  Rx / DC Orders ED Discharge Orders          Ordered    oxyCODONE-acetaminophen (PERCOCET) 5-325 MG tablet  Every 4 hours PRN        08/30/22 1842            The plan for this patient was discussed with Dr. Doran Durand, who voiced agreement and who oversaw evaluation and treatment of this patient.   Clinical Complexity A medically appropriate history, review of systems, and physical exam was performed.  My independent interpretations of EKG,  labs, and radiology are documented in the ED course above.   Click here for ABCD2, HEART and other calculatorsREFRESH Note before signing   Patient's presentation is most consistent with acute presentation with potential threat to life or bodily function.  Medical Decision Making Risk Prescription drug management.    HPI/ROS      See MDM section for pertinent HPI and ROS. A complete ROS was performed with pertinent positives/negatives noted above.   Past Medical History:  Diagnosis Date   Diabetes mellitus without complication (HCC)    Hypertension    Lumbar herniated disc    Pilonidal disease    Psoriasis    Sciatica 2017   Left   Wears contact lenses    Wears glasses     Past Surgical History:  Procedure Laterality Date   ABCESS DRAINAGE     Pilonidal cyst   LUMBAR LAMINECTOMY/DECOMPRESSION MICRODISCECTOMY Left 04/08/2018   Procedure: Left L5-S1 discectomy;  Surgeon: Venita Lick, MD;  Location: [facility] OR;  Service: Orthopedics;  Laterality: Left;  120 mins   PILONIDAL CYST EXCISION N/A 12/04/2017   Procedure: EXCISION OF PILONIDAL DISEASE;  Surgeon: Karie Soda, MD;  Location: [facility];  Service: General;  Laterality: N/A;   WISDOM TOOTH EXTRACTION        Physical Exam   Vitals:   08/30/22 2055 08/30/22 2110 08/30/22 2125 08/30/22 2151  BP: (!) 153/96 (!) 147/88 (!) 145/89 (!) 150/94  Pulse: 80 82 82 90  Resp: (!) 21 19 20  (!) 22  Temp:    99.2 F (37.3  C)  TempSrc:    Oral  SpO2: 96% 96% 97% 92%  Weight:      Height:        Physical Exam Vitals and nursing note reviewed.  Constitutional:      General: He is not in acute distress.    Appearance: He is well-developed.  HENT:     Head: Normocephalic and atraumatic.  Eyes:     Conjunctiva/sclera: Conjunctivae normal.  Cardiovascular:     Rate and Rhythm: Normal rate and regular rhythm.     Heart sounds: No murmur heard. Pulmonary:     Effort: Pulmonary effort is normal. No  respiratory distress.     Breath sounds: Normal breath sounds.  Abdominal:     Palpations: Abdomen is soft.     Tenderness: There is abdominal tenderness in the right upper quadrant.  Musculoskeletal:        General: No swelling.     Cervical back: Neck supple.  Skin:    General: Skin is warm and dry.     Capillary Refill: Capillary refill takes less than 2 seconds.  Neurological:     Mental Status: He is alert.  Psychiatric:        Mood and Affect: Mood normal.    Starleen Arms, MD Department of Emergency Medicine   Please note that this documentation was produced with the assistance of voice-to-text technology and may contain errors.    Dyanne Iha, MD 08/30/22 2956    Glyn Ade, MD 08/30/22 [id number]

## 2022-08-30 NOTE — Progress Notes (Signed)
Pharmacy Antibiotic Note  Kyle [name] is a 42 y.o. male admitted on 08/30/2022 s/p cholecystectomy with concern for gangrenous cholecystitis. Pharmacy has been consulted for zosyn dosing.  Plan: Zosyn 3.375g q8h  F/u renal function, clinical status and length of therapy  Height: 6\' 3"  (190.5 cm) Weight: (!) 136.5 kg (301 lb) IBW/kg (Calculated) : 84.5  Temp (24hrs), Avg:98.2 F (36.8 C), Min:97.9 F (36.6 C), Max:98.8 F (37.1 C)  Recent Labs  Lab 08/26/22 1013 08/26/22 1015 08/29/22 1126 08/30/22 1218  WBC 11.8*  --  11.1* 9.9  CREATININE  --  1.13 1.03 1.20    Estimated Creatinine Clearance: 120.7 mL/min (by C-G formula based on SCr of 1.2 mg/dL).    No Known Allergies  Antimicrobials this admission: Zosyn 8/17 > (8/20)   Thank you for allowing pharmacy to be a part of this patient's care.  Marja Kays 08/30/2022 9:03 PM

## 2022-08-30 NOTE — ED Triage Notes (Signed)
Pt arrived POV from home c/o abdominal pain x1 week. Pt states he had a CT done yesterday and received a call stating he has a gallstone blocking his duct and needed to come to be admitted to have his gallbladder removed.

## 2022-08-30 NOTE — Anesthesia Procedure Notes (Signed)
Procedure Name: Intubation Date/Time: 08/30/2022 7:16 PM  Performed by: Edmonia Caprio, CRNAPre-anesthesia Checklist: Emergency Drugs available, Suction available, Patient being monitored, Timeout performed and Patient identified Patient Re-evaluated:Patient Re-evaluated prior to induction Oxygen Delivery Method: Circle system utilized Preoxygenation: Pre-oxygenation with 100% oxygen Induction Type: IV induction Ventilation: Mask ventilation without difficulty Laryngoscope Size: Glidescope and 4 Grade View: Grade I Tube type: Oral Tube size: 7.5 mm Number of attempts: 1 Airway Equipment and Method: Stylet and Video-laryngoscopy Placement Confirmation: positive ETCO2, ETT inserted through vocal cords under direct vision and breath sounds checked- equal and bilateral Secured at: 22 cm Tube secured with: Tape Dental Injury: Teeth and Oropharynx as per pre-operative assessment

## 2022-08-30 NOTE — ED Provider Triage Note (Signed)
Emergency Medicine Provider Triage Evaluation Note  Kyle [name] , a 42 y.o. male  was evaluated in triage.  Pt complains of vomiting and epigastric pain, woke from sleep w/ indigestion and vomiting 1 week + heartburn and vomiting x4. Had a fever of 101 on fri/wed and RUQ pain. [facility] - [facility] ct +for obstructing stone  Review of Systems  Positive: RUQ pain , tea colored urine Negative:   Physical Exam  BP (!) 140/99   Pulse 72   Temp 98.2 F (36.8 C) (Oral)   Resp 16   Ht 6\' 3"  (1.905 m)   Wt (!) 136.5 kg   SpO2 96%   BMI 37.62 kg/m  Gen:   Awake, no distress   Resp:  Normal effort  MSK:   Moves extremities without difficulty  Other:    Medical Decision Making  Medically screening exam initiated at 12:01 PM.  Appropriate orders placed.  Kyle [name] was informed that the remainder of the evaluation will be completed by another provider, this initial triage assessment does not replace that evaluation, and the importance of remaining in the ED until their evaluation is complete.     Arthor Captain, PA-C 08/30/22 1205

## 2022-08-30 NOTE — Op Note (Signed)
Patient: Kyle [name] (1980-03-13, [id number])  Date of Surgery: 08/30/2022   Preoperative Diagnosis: Cholecystitis   Postoperative Diagnosis: Gangrenous Cholecystitis   Surgical Procedure: LAPAROSCOPIC CHOLECYSTECTOMY:  with JP drain placement in gallbladder fossa  Operative Team Members:  Surgeons and Role:    * Kyle [name], Kyle Hopes, MD - Primary   Anesthesiologist: Kyle Schlichter, MD CRNA: Kyle Caprio, CRNA   Anesthesia: General   Fluids:  Total I/O In: 100 [IV Piggyback:100] Out: -   Complications: None  Drains:  none   Specimen:  ID Type Source Tests Collected by Time Destination  1 :  Tissue PATH Gallbladder SURGICAL PATHOLOGY Kyle [name], Kyle Hopes, MD 08/30/2022 1901      Disposition:  PACU - hemodynamically stable.  Plan of Care: Admit for overnight observation    Indications for Procedure: Kyle [name] is a 42 y.o. male who presented with a week of abdominal pain.  History, physical and imaging was concerning for cholecystitis.  Laparoscopic cholecystectomy was recommended for the patient.  The procedure itself, as well as the risks, benefits and alternatives were discussed with the patient.  Risks discussed included but were not limited to the risk of infection, bleeding, damage to nearby structures, need to convert to open procedure, incisional hernia, bile leak, common bile duct injury and the need for additional procedures or surgeries.  With this discussion complete and all questions answered the patient granted consent to proceed.  Findings: gangrenous cholecystitis with thick inflammatory rind  Infection status: Patient: Kyle [name] [facility] Service Patient Case: Urgent Infection Present At Time Of Surgery (PATOS):  Gangrenous gallbladder with spillage of purulent bile   Description of Procedure:   On the date stated above, the patient was taken to the operating room suite and placed in supine positioning.  Sequential  compression devices were placed on the lower extremities to prevent blood clots.  General endotracheal anesthesia was induced. Preoperative antibiotics were given.  The patient's abdomen was prepped and draped in the usual sterile fashion.  A time-out was completed verifying the correct patient, procedure, positioning and equipment needed for the case.  We began by anesthetizing the skin with local anesthetic and then making a 5 mm incision just below the umbilicus.  We dissected through the subcutaneous tissues to the fascia.  The fascia was grasped and elevated using a Kocher clamp.  A Veress needle was inserted into the abdomen and the abdomen was insufflated to 15 mmHg.  A 5 mm trocar was inserted in this position under optical guidance and then the abdomen was inspected.  There was no trauma to the underlying viscera with initial trocar placement.  Any abnormal findings, other than inflammation in the right upper quadrant, are listed above in the findings section.  Three additional trocars were placed, one 12 mm trocar in the subxiphoid position, one 5 mm trocar in the midline epigastric area and one 5mm trocar in the right upper quadrant subcostally.  These were placed under direct vision without any trauma to the underlying viscera.    The patient was then placed in head up, left side down positioning.  The gallbladder was identified and dissected free from its attachments to the omentum allowing the duodenum to fall away.  The infundibulum of the gallbladder was dissected free working laterally to medially.  The cystic duct and cystic artery were dissected free from surrounding connective tissue.  The infundibulum of the gallbladder was dissected off the cystic plate.  A critical view  of safety was obtained with the cystic duct and cystic artery being cleared of connective tissues and clearly the only two structures entering into the gallbladder with the liver clearly visible behind.  Clips were then  applied to the cystic duct and cystic artery and then these structures were divided.  A PDS endoloop was placed on the cytic duct stump. The gallbladder was dissected off the cystic plate, placed in an endocatch bag and removed from the 12 mm subxiphoid port site.  The clips were inspected and appeared effective.  The cystic plate was inspected and hemostasis was obtained using electrocautery.  A suction irrigator was used to clean the operative field.  A 19 Fr. Round JP drain was positioned in the gallbladder fossa and exiting the right abdominal port site.  It was secured to the skin using a nylon suture.  Attention was turned to closure.  The 12 mm subxiphoid port site was closed using a 0-vicryl suture on a fascial suture passer.  The abdomen was desufflated.  The skin was closed using 4-0 monocryl and dermabond.  All sponge and needle counts were correct at the conclusion of the case.    Kyle Drape, MD General, Bariatric, & Minimally Invasive Surgery [facility], [place]

## 2022-08-30 NOTE — Transfer of Care (Signed)
Immediate Anesthesia Transfer of Care Note  Patient: Kyle [name]  Procedure(s) Performed: LAPAROSCOPIC CHOLECYSTECTOMY  Patient Location: PACU  Anesthesia Type:General  Level of Consciousness: drowsy and patient cooperative  Airway & Oxygen Therapy: Patient Spontanous Breathing  Post-op Assessment: Report given to RN and Post -op Vital signs reviewed and stable  Post vital signs: Reviewed and stable  Last Vitals:  Vitals Value Taken Time  BP 148/102 08/30/22 2040  Temp    Pulse 89 08/30/22 2042  Resp 26 08/30/22 2042  SpO2 94 % 08/30/22 2042  Vitals shown include unfiled device data.  Last Pain:  Vitals:   08/30/22 1819  TempSrc: Oral  PainSc:          Complications: No notable events documented.

## 2022-08-31 ENCOUNTER — Encounter (HOSPITAL_COMMUNITY): Payer: Self-pay | Admitting: Surgery

## 2022-08-31 LAB — CBC
HCT: 46 % (ref 39.0–52.0)
Hemoglobin: 15.4 g/dL (ref 13.0–17.0)
MCH: 29.8 pg (ref 26.0–34.0)
MCHC: 33.5 g/dL (ref 30.0–36.0)
MCV: 89.1 fL (ref 80.0–100.0)
Platelets: 389 10*3/uL (ref 150–400)
RBC: 5.16 MIL/uL (ref 4.22–5.81)
RDW: 12.9 % (ref 11.5–15.5)
WBC: 11.5 10*3/uL — ABNORMAL HIGH (ref 4.0–10.5)
nRBC: 0 % (ref 0.0–0.2)

## 2022-08-31 LAB — BASIC METABOLIC PANEL
Anion gap: 10 (ref 5–15)
BUN: 8 mg/dL (ref 6–20)
CO2: 27 mmol/L (ref 22–32)
Calcium: 8.8 mg/dL — ABNORMAL LOW (ref 8.9–10.3)
Chloride: 99 mmol/L (ref 98–111)
Creatinine, Ser: 1.2 mg/dL (ref 0.61–1.24)
GFR, Estimated: >60 mL/min (ref >60)
Glucose, Bld: 185 mg/dL — ABNORMAL HIGH (ref 70–99)
Potassium: 4.7 mmol/L (ref 3.5–5.1)
Sodium: 136 mmol/L (ref 135–145)

## 2022-08-31 LAB — CREATININE, SERUM
Creatinine, Ser: 1.16 mg/dL (ref 0.61–1.24)
GFR, Estimated: >60 mL/min (ref >60)

## 2022-08-31 MED ORDER — AMOXICILLIN-POT CLAVULANATE 500-125 MG PO TABS
1.0000 | ORAL_TABLET | Freq: Three times a day (TID) | ORAL | 0 refills | Status: DC
  Filled 2022-08-31: qty 12, 4d supply, fill #0

## 2022-08-31 MED ORDER — AMOXICILLIN-POT CLAVULANATE 500-125 MG PO TABS: 1.0000 | ORAL_TABLET | Freq: Three times a day (TID) | ORAL | 0 refills | Status: DC

## 2022-08-31 MED ORDER — OXYCODONE-ACETAMINOPHEN 5-325 MG PO TABS: 1.0000 | ORAL_TABLET | ORAL | 0 refills | Status: DC | PRN

## 2022-08-31 MED ORDER — GABAPENTIN 300 MG PO CAPS
300.0000 mg | ORAL_CAPSULE | Freq: Three times a day (TID) | ORAL | Status: DC
  Administered 2022-08-31: 300 mg via ORAL
  Filled 2022-08-31: qty 1

## 2022-08-31 MED ORDER — OXYCODONE-ACETAMINOPHEN 5-325 MG PO TABS
1.0000 | ORAL_TABLET | ORAL | 0 refills | Status: DC | PRN
Start: 2022-08-31 — End: 2022-08-31

## 2022-08-31 MED ORDER — AMOXICILLIN-POT CLAVULANATE 500-125 MG PO TABS: 1.0000 | ORAL_TABLET | Freq: Three times a day (TID) | ORAL | 0 refills | Status: AC

## 2022-08-31 NOTE — Progress Notes (Signed)
I personally contacted patient same-day, informing him of results, and directing him to the ED for admission for surgery. Patient was agreeable and would head there after his wife returned that evening.

## 2022-08-31 NOTE — Anesthesia Postprocedure Evaluation (Signed)
Anesthesia Post Note  Patient: Kyle [name]  Procedure(s) Performed: LAPAROSCOPIC CHOLECYSTECTOMY     Patient location during evaluation: PACU Anesthesia Type: General Level of consciousness: awake and alert Pain management: pain level controlled Vital Signs Assessment: post-procedure vital signs reviewed and stable Respiratory status: spontaneous breathing, nonlabored ventilation, respiratory function stable and patient connected to nasal cannula oxygen Cardiovascular status: blood pressure returned to baseline and stable Postop Assessment: no apparent nausea or vomiting Anesthetic complications: no   No notable events documented.  Last Vitals:  Vitals:   08/31/22 0036 08/31/22 0448  BP: (!) 136/92 120/89  Pulse: 82 74  Resp: (!) 24 (!) 24  Temp: 37.1 C 37.4 C  SpO2: 96% 97%    Last Pain:  Vitals:   08/31/22 0448  TempSrc: Oral  PainSc:                  Collene Schlichter

## 2022-08-31 NOTE — Discharge Summary (Signed)
Physician Discharge Summary  Patient ID: Kyle [name] MRN: [id number] DOB/AGE: January 24, 1980 42 y.o.  Admit date: 08/30/2022 Discharge date: 08/31/2022  Admission Diagnoses: Acute cholecystitis   Discharge Diagnoses:  Principal Problem:   Status post surgery Active Problems:   Acute cholecystitis   Discharged Condition: stable  Hospital Course: He presented to the ED with several days of abdominal pain.  His imaging and exam were consistent with acute cholecystitis, and on 8/17 he was taken by Dr. Rich Reining for a lap cholecystectomy.  A drain was left.  He tolerated the procedure well and discharged to home on POD 1. At the time of discharge he was tolerating a regular diet, his pain was controlled on an oral regimen, and he was ambulating at baseline.    Consults: None  Significant Diagnostic Studies: CT: acute cholecystitis   Treatments: surgery: lap chole (08/30/22)  Discharge Exam: Blood pressure 134/88, pulse 76, temperature 99 F (37.2 C), resp. rate 17, height 6\' 3"  (1.905 m), weight (!) 136.5 kg, SpO2 98%. See progress note  Disposition: Home w/ drain   Allergies as of 08/31/2022   No Known Allergies      Medication List     TAKE these medications    amoxicillin-clavulanate 500-125 MG tablet Commonly known as: Augmentin Take 1 tablet by mouth 3 (three) times daily for 4 days.   atorvastatin 10 MG tablet Commonly known as: LIPITOR Take 1 tablet (10 mg total) by mouth daily.   lisinopril 10 MG tablet Commonly known as: ZESTRIL Take 1 tablet (10 mg total) by mouth in the morning.   Mounjaro 7.5 MG/0.5ML Pen Generic drug: tirzepatide Inject 7.5 mg into the skin once a week.   ondansetron 8 MG tablet Commonly known as: ZOFRAN Take 1 tablet (8 mg total) by mouth every 8 (eight) hours as needed for nausea or vomiting.   oxyCODONE-acetaminophen 5-325 MG tablet Commonly known as: Percocet Take 1 tablet by mouth every 4 (four) hours as needed for severe  pain.        Follow-up Information     Stechschulte, Hyman Hopes, MD Follow up in 4 week(s).   Specialty: Surgery Contact information: [address] [place] [address] [phone]                 Signed: Moise Boring 08/31/2022, 10:48 AM

## 2022-08-31 NOTE — Progress Notes (Signed)
1 Day Post-Op  Subjective: Reports that his pain is improved.  Tolerated CLD. Drain is SS.   ROS: See above, otherwise other systems negative  Objective: Vital signs in last 24 hours: Temp:  [97.9 F (36.6 C)-99.3 F (37.4 C)] 99 F (37.2 C) (08/18 0854) Pulse Rate:  [72-92] 76 (08/18 0854) Resp:  [16-24] 17 (08/18 0854) BP: (120-156)/(88-99) 134/88 (08/18 0854) SpO2:  [92 %-98 %] 98 % (08/18 0854) Weight:  [136.5 kg] 136.5 kg (08/17 1145) Last BM Date : 08/29/22  Intake/Output from previous day: 08/17 0701 - 08/18 0700 In: 2333.5 [P.O.:720; I.V.:1297.2; IV Piggyback:316.3] Out: 2145 [Urine:2025; Drains:100; Blood:20] Intake/Output this shift: Total I/O In: -  Out: 700 [Urine:700]  PE: Gen: adult male, NAD Resp: equal chest rise, no increased WOB CV: RRR Abd: soft, non-distended, appropriately tender, port sites clean and dry with dermabond intact  Lab Results:  Recent Labs    08/30/22 2316 08/31/22 0434  WBC 13.3* 11.5*  HGB 15.5 15.4  HCT 46.1 46.0  PLT 396 389   BMET Recent Labs    08/30/22 1218 08/30/22 2316 08/31/22 0434  NA 137  --  136  K 4.1  --  4.7  CL 101  --  99  CO2 25  --  27  GLUCOSE 125*  --  185*  BUN 8  --  8  CREATININE 1.20 1.16 1.20  CALCIUM 8.9  --  8.8*   PT/INR No results for input(s): "LABPROT", "INR" in the last 72 hours. CMP     Component Value Date/Time   NA 136 08/31/2022 0434   K 4.7 08/31/2022 0434   CL 99 08/31/2022 0434   CO2 27 08/31/2022 0434   GLUCOSE 185 (H) 08/31/2022 0434   BUN 8 08/31/2022 0434   CREATININE 1.20 08/31/2022 0434   CALCIUM 8.8 (L) 08/31/2022 0434   PROT 7.9 08/30/2022 1218   ALBUMIN 3.7 08/30/2022 1218   AST 21 08/30/2022 1218   ALT 33 08/30/2022 1218   ALKPHOS 87 08/30/2022 1218   BILITOT 0.5 08/30/2022 1218   GFRNONAA >60 08/31/2022 0434   Lipase     Component Value Date/Time   LIPASE 27 08/30/2022 1218    Studies/Results: CT ABDOMEN PELVIS W CONTRAST  Result Date:  08/29/2022 CLINICAL DATA:  Epigastric pain and right upper quadrant pain. Fever and chills. EXAM: CT ABDOMEN AND PELVIS WITH CONTRAST TECHNIQUE: Multidetector CT imaging of the abdomen and pelvis was performed using the standard protocol following bolus administration of intravenous contrast. RADIATION DOSE REDUCTION: This exam was performed according to the departmental dose-optimization program which includes automated exposure control, adjustment of the mA and/or kV according to patient size and/or use of iterative reconstruction technique. CONTRAST:  OMNIPAQUE IOHEXOL 300 MG/ML  SOLN COMPARISON:  09/13/2021 FINDINGS: Lower chest: No acute abnormality. Hepatobiliary: No focal liver abnormality is seen. Gallbladder wall thickening with pericholecystic inflammatory changes identified. There is a stone noted within the cystic duct which measures 3 mm. No common bile duct dilatation. Pancreas: Unremarkable. No pancreatic ductal dilatation or surrounding inflammatory changes. Spleen: Normal in size without focal abnormality. Adrenals/Urinary Tract: Normal adrenal glands. The right kidney is normal. Stone within the mid left kidney measures 6 mm. No mass or hydronephrosis identified bilaterally. Urinary bladder appears normal. Stomach/Bowel: Stomach is normal. The appendix is visualized and is within normal limits. Sigmoid diverticulosis without signs of acute diverticulitis. Vascular/Lymphatic: No significant vascular findings are present. No enlarged abdominal or pelvic lymph nodes. Reproductive: Prostate  is unremarkable. Other: No ascites or focal fluid collections. Musculoskeletal: No acute or significant osseous findings. Degenerative disc disease noted at L5-S1. IMPRESSION: 1. Gallbladder wall thickening with pericholecystic inflammatory changes identified. There is a 3 mm stone noted within the cystic duct. Findings are compatible with acute cholecystitis. 2. Nonobstructing left renal calculus. 3. Sigmoid  diverticulosis without signs of acute diverticulitis. Electronically Signed   By: Signa Kell M.D.   On: 08/29/2022 16:48    Anti-infectives: Anti-infectives (From admission, onward)    Start     Dose/Rate Route Frequency Ordered Stop   08/30/22 2200  piperacillin-tazobactam (ZOSYN) IVPB 3.375 g        3.375 g 12.5 mL/hr over 240 Minutes Intravenous Every 8 hours 08/30/22 2057 09/03/22 2159   08/30/22 1803  ceFAZolin (ANCEF) 2-4 GM/100ML-% IVPB       Note to Pharmacy: Kathrene Bongo D: cabinet override      08/30/22 1803 08/31/22 0614   08/30/22 1645  ceFAZolin (ANCEF) IVPB 2g/100 mL premix  Status:  Discontinued        2 g 200 mL/hr over 30 Minutes Intravenous Every 8 hours 08/30/22 1636 08/31/22 1003       Assessment/Plan  42 y/o M presenting with acute cholecystitis now s/p lap chole   FEN - Regular diet VTE - SCDs ID - Zosyn --> Augmentin x 4 days Dispo - Home today w/ drain  I reviewed last 24 h vitals and pain scores, last 48 h intake and output, last 24 h labs and trends, and last 24 h imaging results.  This care required moderate level of medical decision making.    LOS: 1 day   Tacy [facility] 08/31/2022, 10:26 AM Please see Amion for pager number during day hours 7:00am-4:30pm or 7:00am -11:30am on weekends

## 2022-09-01 ENCOUNTER — Other Ambulatory Visit: Payer: Self-pay

## 2022-09-01 ENCOUNTER — Other Ambulatory Visit (HOSPITAL_COMMUNITY): Payer: Self-pay

## 2022-09-01 ENCOUNTER — Telehealth: Payer: Self-pay

## 2022-09-01 ENCOUNTER — Encounter: Payer: Self-pay | Admitting: Family Medicine

## 2022-09-01 LAB — GLUCOSE, CAPILLARY: Glucose-Capillary: 158 mg/dL — ABNORMAL HIGH (ref 70–99)

## 2022-09-01 NOTE — Telephone Encounter (Signed)
If he is doing well it is ok for him to keep going with it.   I am glad he is feeling better. Recommend he follow up with [place] soon.  Katina Degree. Jimmey Ralph, MD 09/01/2022 1:03 PM

## 2022-09-01 NOTE — Telephone Encounter (Signed)
Please advise 

## 2022-09-01 NOTE — Transitions of Care (Post Inpatient/ED Visit) (Signed)
   09/01/2022  Name: Kyle [name] MRN: [id number] DOB: Dec 27, 1980  Today's TOC FU Call Status: Today's TOC FU Call Status:: Successful TOC FU Call Completed TOC FU Call Complete Date: 09/01/22  Transition Care Management Follow-up Telephone Call Date of Discharge: 08/31/22 Discharge Facility: [name]) Type of Discharge: Inpatient Admission Primary Inpatient Discharge Diagnosis:: Acute cholecystitis How have you been since you were released from the hospital?: Better Any questions or concerns?: No  Items Reviewed: Did you receive and understand the discharge instructions provided?: Yes Medications obtained,verified, and reconciled?: Yes (Medications Reviewed) Any new allergies since your discharge?: No Dietary orders reviewed?: Yes Do you have support at home?: Yes  Medications Reviewed Today: Medications Reviewed Today     Reviewed by Merleen Nicely, LPN (Licensed Practical Nurse) on 09/01/22 at 1322  Med List Status: <None>   Medication Order Taking? Sig Documenting Provider Last Dose Status Informant  amoxicillin-clavulanate (AUGMENTIN) 500-125 MG tablet [id number] Yes Take 1 tablet by mouth 3 (three) times daily for 4 days. Moise Boring, MD Taking Active   atorvastatin (LIPITOR) 10 MG tablet [id number] No Take 1 tablet (10 mg total) by mouth daily.  Patient not taking: Reported on 08/29/2022    Not Taking Active Self, Pharmacy Records  lisinopril (ZESTRIL) 10 MG tablet [id number] Yes Take 1 tablet (10 mg total) by mouth in the morning.  Taking Active Self, Pharmacy Records  ondansetron (ZOFRAN) 8 MG tablet [id number] Yes Take 1 tablet (8 mg total) by mouth every 8 (eight) hours as needed for nausea or vomiting. Allwardt, Crist Infante, PA-C Taking Active Self, Pharmacy Records  oxyCODONE-acetaminophen (PERCOCET) 5-325 MG tablet [id number] Yes Take 1 tablet by mouth every 4 (four) hours as needed for severe pain. Moise Boring, MD Taking Active   tirzepatide  [facility]) 7.5 MG/0.5ML Pen [id number] Yes Inject 7.5 mg into the skin once a week. Ardith Dark, MD Taking Active Self, Pharmacy Records            Home Care and Equipment/Supplies: Were Home Health Services Ordered?: No Any new equipment or medical supplies ordered?: No  Functional Questionnaire: Do you need assistance with bathing/showering or dressing?: No Do you need assistance with meal preparation?: No Do you need assistance with eating?: No Do you have difficulty maintaining continence: No Do you need assistance with getting out of bed/getting out of a chair/moving?: No Do you have difficulty managing or taking your medications?: No  Follow up appointments reviewed: PCP Follow-up appointment confirmed?: No MD Provider Line Number:[phone] Given: Yes Specialist Hospital Follow-up appointment confirmed?: No Reason Specialist Follow-Up Not Confirmed: Patient has Specialist Provider Number and will Call for Appointment Do you need transportation to your follow-up appointment?: No Do you understand care options if your condition(s) worsen?: Yes-patient verbalized understanding    SIGNATURE  Woodfin Ganja LPN [facility] Nurse Health Advisor Direct Dial [phone]

## 2022-09-02 LAB — SURGICAL PATHOLOGY

## 2022-10-03 ENCOUNTER — Other Ambulatory Visit: Payer: Self-pay | Admitting: Family Medicine

## 2022-10-03 ENCOUNTER — Other Ambulatory Visit: Payer: Self-pay

## 2022-10-03 MED ORDER — MOUNJARO 7.5 MG/0.5ML ~~LOC~~ SOAJ
7.5000 mg | SUBCUTANEOUS | 0 refills | Status: DC
  Filled 2022-10-03: qty 2, 28d supply, fill #0

## 2022-10-07 ENCOUNTER — Encounter: Payer: Self-pay | Admitting: Neurology

## 2022-10-07 ENCOUNTER — Ambulatory Visit (INDEPENDENT_AMBULATORY_CARE_PROVIDER_SITE_OTHER): Payer: Managed Care, Other (non HMO) | Admitting: Neurology

## 2022-10-07 VITALS — BP 132/91 | HR 84 | Ht 75.0 in | Wt 295.0 lb

## 2022-10-07 DIAGNOSIS — G4733 Obstructive sleep apnea (adult) (pediatric): Secondary | ICD-10-CM | POA: Diagnosis not present

## 2022-10-07 DIAGNOSIS — Z789 Other specified health status: Secondary | ICD-10-CM | POA: Diagnosis not present

## 2022-10-07 NOTE — Patient Instructions (Addendum)
It was nice to see you again today.  I am glad to hear that your AutoPap is more tolerable and you are doing better.  I am glad you recuperated from your gallbladder surgery.  You are not quite there when it comes to insurance mandated compliance but you have certainly gotten a lot better from what I can see in your download.  Please try to be consistent with your usage.  We can have you follow-up with a telemedicine visit if you like in about 12 months to see the nurse practitioner and if you are doing well at that time we can do yearly checkups after that. We can review your compliance download/report in about 1 month. Please send [place] a MyChart message or call as a reminder.

## 2022-10-07 NOTE — Progress Notes (Signed)
Subjective:    Patient ID: Kyle [name] is a 42 y.o. male.  HPI    Interim history:    Kyle [name] is a 42 year old male with an underlying medical history of hypertension, hyperlipidemia, diabetes, degenerative lumbar disc disease with sciatica, nephrolithiasis, psoriasis, and obesity, who presents for follow-up consultation of his obstructive sleep apnea after interim testing and starting home AutoPap therapy.  The patient is unaccompanied today.  I first met him at the request of his primary care physician on 06/18/2022, at which time he reported snoring and witnessed apneas as well as some daytime tiredness.  He was advised to proceed with a sleep study.  He had a home sleep test through our office on.  07/05/2022 which showed moderate obstructive sleep apnea with an AHI of 15.4/h, O2 nadir 89% with moderate to loud snoring detected for most of the night.  He was advised to proceed with home AutoPap therapy.  His set up date was 07/23/2022.  He has a ResMed air sense 10 AutoSet machine.  His DME provider is [facility].  He called in the interim reporting that the pressure felt too high.  His average pressure was around 9 cm with a range of 6-12.  He was advised to try over-the-counter Zyrtec and saline nasal spray for nasal decongestion.  Today, 10/07/2022: I reviewed his AutoPap compliance data from 09/06/2022 through 10/05/2022, which is a total of 30 days, during which time he used his machine 27 days with percent use days greater than 4 hours at 43%, indicating suboptimal compliance with an average usage of 3 hours and 49 minutes for days on treatment, residual AHI at goal at 0.3/h, leak acceptable with the 95th percentile at 5.9 L/min on a pressure range of 6 to 12 cm with EPR of 3.  95th percentile of pressure at 9.2 cm.  He reports doing better overall, is adjusting to treatment.  He had several things going on that made it difficult for him to be very consistent.  When he first got his machine he  immediately went on vacation, it was hard for him the sleep away from home, he then developed nasal congestion which indeed is better with decongestant measures.  He had to get used to sleeping on his back more consistently as side sleeping dislodged his mask which is a nasal pillows interface.  He recently had a cholecystectomy on 08/30/2022.  He reports that since then he has done better.  He is motivated to continue with treatment.  He has noted benefit in terms of his daytime energy and certainly his snoring per wife's feedback is a lot better.  He is up-to-date with his supplies.  The patient's allergies, current medications, family history, past medical history, past social history, past surgical history and problem list were reviewed and updated as appropriate.   Previously:   06/18/2022: (He) reports snoring and some daytime tiredness, as well as witnessed apneas per wife's report.  His Epworth sleepiness score is 7 out of 24, fatigue severity score is 37 out of 63.  I reviewed your office note from 05/13/2022.  Reports that sleep apnea concerns have been going on for years.  He has an elevated blood pressure value today.  He denies any recurrent headaches, particularly nocturnal or morning headaches.  He has no nightly nocturia.  Bedtime is generally between 9:30 PM and 10 PM and rise time around 6 AM.  He lives with his family which includes his wife and daughters.  He  has a total of 5 daughters, the oldest 1 lives in [place], the other 4 daughters live at home, second oldest getting ready to go to college.  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  They have 3 dogs in the household, none of the dogs sleep in the bedroom with them.  He is not aware of any family history of sleep apnea.  He drinks caffeine in the form of coffee, usually 1 large cup in the morning.  He drinks occasional soda.  He is a non-smoker and drinks alcohol occasionally to rarely.  His weight has been more or less  stable.  His Past Medical History Is Significant For: Past Medical History:  Diagnosis Date   Diabetes mellitus without complication (HCC)    Hypertension    Lumbar herniated disc    Pilonidal disease    Psoriasis    Sciatica 2017   Left   Wears contact lenses    Wears glasses     His Past Surgical History Is Significant For: Past Surgical History:  Procedure Laterality Date   ABCESS DRAINAGE     Pilonidal cyst   CHOLECYSTECTOMY N/A 08/30/2022   Procedure: LAPAROSCOPIC CHOLECYSTECTOMY;  Surgeon: Quentin Ore, MD;  Location: [facility] OR;  Service: General;  Laterality: N/A;   LUMBAR LAMINECTOMY/DECOMPRESSION MICRODISCECTOMY Left 04/08/2018   Procedure: Left L5-S1 discectomy;  Surgeon: Venita Lick, MD;  Location: [facility] OR;  Service: Orthopedics;  Laterality: Left;  120 mins   PILONIDAL CYST EXCISION N/A 12/04/2017   Procedure: EXCISION OF PILONIDAL DISEASE;  Surgeon: Karie Soda, MD;  Location: [facility];  Service: General;  Laterality: N/A;   WISDOM TOOTH EXTRACTION      His Family History Is Significant For: Family History  Problem Relation Age of Onset   Colon cancer Mother    Hypertension Father    Diabetes Father    Kidney cancer Father    Inflammatory bowel disease Sister     His Social History Is Significant For: Social History   Socioeconomic History   Marital status: Married    Spouse name: Not on file   Number of children: Not on file   Years of education: Not on file   Highest education level: Some college, no degree  Occupational History   Not on file  Tobacco Use   Smoking status: Never   Smokeless tobacco: Never  Vaping Use   Vaping status: Never Used  Substance and Sexual Activity   Alcohol use: Not Currently    Comment: "hardly ever" beer every now and then   Drug use: No   Sexual activity: Not on file  Other Topics Concern   Not on file  Social History Narrative   ** Merged History Encounter **       Social  Determinants of Health   Financial Resource Strain: Low Risk  (08/08/2022)   Overall Financial Resource Strain (CARDIA)    Difficulty of Paying Living Expenses: Not very hard  Food Insecurity: No Food Insecurity (08/08/2022)   Hunger Vital Sign    Worried About Running Out of Food in the Last Year: Never true    Ran Out of Food in the Last Year: Never true  Transportation Needs: No Transportation Needs (08/08/2022)   PRAPARE - Administrator, Civil Service (Medical): No    Lack of Transportation (Non-Medical): No  Physical Activity: Unknown (08/08/2022)   Exercise Vital Sign    Days of Exercise per Week: 0 days  Minutes of Exercise per Session: Not on file  Stress: No Stress Concern Present (08/08/2022)   [employer] of Occupational Health - Occupational Stress Questionnaire    Feeling of Stress : Only a little  Social Connections: Socially Isolated (08/08/2022)   Social Connection and Isolation Panel [NHANES]    Frequency of Communication with Friends and Family: Once a week    Frequency of Social Gatherings with Friends and Family: Once a week    Attends Religious Services: Never    Database administrator or Organizations: No    Attends Engineer, structural: Not on file    Marital Status: Married    His Allergies Are:  No Known Allergies:   His Current Medications Are:  Outpatient Encounter Medications as of 10/07/2022  Medication Sig   lisinopril (ZESTRIL) 10 MG tablet Take 1 tablet (10 mg total) by mouth in the morning.   tirzepatide (MOUNJARO) 7.5 MG/0.5ML Pen Inject 7.5 mg into the skin once a week.   atorvastatin (LIPITOR) 10 MG tablet Take 1 tablet (10 mg total) by mouth daily. (Patient not taking: Reported on 10/07/2022)   oxyCODONE-acetaminophen (PERCOCET) 5-325 MG tablet Take 1 tablet by mouth every 4 (four) hours as needed for severe pain. (Patient not taking: Reported on 10/07/2022)   [DISCONTINUED] ondansetron (ZOFRAN) 8 MG tablet Take 1 tablet  (8 mg total) by mouth every 8 (eight) hours as needed for nausea or vomiting.   No facility-administered encounter medications on file as of 10/07/2022.  :  Review of Systems:  Out of a complete 14 point review of systems, all are reviewed and negative with the exception of these symptoms as listed below:  Review of Systems  Neurological:        Patient is here alone for initial follow-up of cpap machine. Pt states he is doing better now. He had an issue with congestion, then he had his gallbladder removed. He is states he is doing a lot better with it now. He is overall happy with the mask. He feels more rested during the day. He is on [facility] for Diabetes; so far has lost about 10 lbs and he reports blood sugar is better. ESS 7    Objective:  Neurological Exam  Physical Exam Physical Examination:   Vitals:   10/07/22 0743  BP: (!) 132/91  Pulse: 84    General Examination: The patient is a very pleasant 42 y.o. male in no acute distress. He appears well-developed and well-nourished and well groomed.   HEENT: Normocephalic, atraumatic, pupils are equal, round and reactive to light, corrective eyeglasses in place.  Extraocular tracking is well-preserved.  No obvious nystagmus. Hearing is grossly intact. Face is symmetric with normal facial animation. Speech is clear with no dysarthria noted. There is no hypophonia. There is no lip, neck/head, jaw or voice tremor. Neck is supple with full range of passive and active motion. There are no carotid bruits on auscultation. Oropharynx exam reveals: mild mouth dryness, moderate airway crowding and stable findings.  Tongue protrudes centrally and palate elevates symmetrically.     Chest: Clear to auscultation without wheezing, rhonchi or crackles noted.   Heart: S1+S2+0, regular and normal without murmurs, rubs or gallops noted.    Abdomen: Soft, non-tender and non-distended.   Extremities: There is no pitting edema in the distal lower  extremities bilaterally.    Skin: Warm and dry without trophic changes noted.    Musculoskeletal: exam reveals no obvious joint deformities.    Neurologically:  Mental status: The patient is awake, alert and oriented in all 4 spheres. His immediate and remote memory, attention, language skills and fund of knowledge are appropriate. There is no evidence of aphasia, agnosia, apraxia or anomia. Speech is clear with normal prosody and enunciation. Thought process is linear. Mood is normal and affect is normal.  Cranial nerves II - XII are as described above under HEENT exam.  Motor exam: Normal bulk, strength and tone is noted. There is no obvious action or resting tremor.  Fine motor skills and coordination: grossly intact.  Cerebellar testing: No dysmetria or intention tremor. There is no truncal or gait ataxia.  Sensory exam: intact to light touch in the upper and lower extremities.  Gait, station and balance: He stands easily. No veering to one side is noted. No leaning to one side is noted. Posture is age-appropriate and stance is narrow based. Gait shows normal stride length and normal pace. No problems turning are noted.    Assessment and Plan:  In summary, Kyle [name] is a very pleasant 42 year old male with an underlying medical history of hypertension, hyperlipidemia, diabetes, degenerative lumbar disc disease with sciatica, nephrolithiasis, psoriasis, status post laparoscopic cholecystectomy in August 2024, and obesity, who presents for follow-up consultation of his obstructive sleep apnea after interim testing and starting home AutoPap therapy. He had a home sleep test through our office on.  07/05/2022 which showed moderate obstructive sleep apnea with an AHI of 15.4/h, O2 nadir 89% with moderate to loud snoring detected for most of the night.  He has been on home AutoPap therapy since 07/23/2022.  He has a ResMed air sense 10 AutoSet machine.  His DME provider is [facility].  He is  adjusting to treatment, his compliance has indeed improved over the past couple of weeks.  He is very motivated to continue with treatment and has benefited from.  His blood pressure is borderline today but certainly a lot better compared to when we first met in June.  He is working on weight loss and has indeed lost over 10 pounds since June 2024.  He is commended for his treatment adherence and encouraged to be consistent with his AutoPap.  He is currently not taking his atorvastatin but has a physical pending with his PCP and will likely have full blood work at the time.  He is advised to follow-up routinely in this clinic in 1 year to see one of our nurse practitioners.  In the interim, I would be happy to review his compliance again in about a month, he is reminded to call us or email [place] through MyChart so we can review his download.  I answered all his questions today, we reviewed his compliance data and his home sleep test results today.  He was in agreement with our plan.   I spent 30 minutes in total face-to-face time and in reviewing records during pre-charting, more than 50% of which was spent in counseling and coordination of care, reviewing test results, reviewing medications and treatment regimen and/or in discussing or reviewing the diagnosis of OSA, the prognosis and treatment options. Pertinent laboratory and imaging test results that were available during this visit with the patient were reviewed by me and considered in my medical decision making (see chart for details).

## 2022-10-26 ENCOUNTER — Encounter: Payer: Self-pay | Admitting: Neurology

## 2022-10-26 DIAGNOSIS — G4733 Obstructive sleep apnea (adult) (pediatric): Secondary | ICD-10-CM

## 2022-10-27 NOTE — Telephone Encounter (Signed)
Message sent to [facility] to find out what is needed per insurance for restarting therapy.

## 2022-10-28 NOTE — Telephone Encounter (Signed)
[facility] confirmed receipt of order to restart cpap.

## 2022-10-29 ENCOUNTER — Other Ambulatory Visit (HOSPITAL_BASED_OUTPATIENT_CLINIC_OR_DEPARTMENT_OTHER): Payer: Self-pay

## 2022-10-29 ENCOUNTER — Other Ambulatory Visit: Payer: Self-pay

## 2022-10-29 ENCOUNTER — Other Ambulatory Visit: Payer: Self-pay | Admitting: Family Medicine

## 2022-10-29 MED ORDER — MOUNJARO 7.5 MG/0.5ML ~~LOC~~ SOAJ
7.5000 mg | SUBCUTANEOUS | 0 refills | Status: DC
  Filled 2022-10-29: qty 2, 28d supply, fill #0

## 2022-11-13 ENCOUNTER — Encounter: Payer: Self-pay | Admitting: Family Medicine

## 2022-11-13 ENCOUNTER — Ambulatory Visit: Payer: Managed Care, Other (non HMO) | Admitting: Family Medicine

## 2022-11-13 VITALS — BP 111/79 | HR 79 | Temp 97.8°F | Resp 97 | Ht 75.0 in | Wt 295.8 lb

## 2022-11-13 DIAGNOSIS — Z7985 Long-term (current) use of injectable non-insulin antidiabetic drugs: Secondary | ICD-10-CM | POA: Diagnosis not present

## 2022-11-13 DIAGNOSIS — Z0000 Encounter for general adult medical examination without abnormal findings: Secondary | ICD-10-CM | POA: Diagnosis not present

## 2022-11-13 DIAGNOSIS — E1159 Type 2 diabetes mellitus with other circulatory complications: Secondary | ICD-10-CM

## 2022-11-13 DIAGNOSIS — I152 Hypertension secondary to endocrine disorders: Secondary | ICD-10-CM | POA: Diagnosis not present

## 2022-11-13 DIAGNOSIS — Z23 Encounter for immunization: Secondary | ICD-10-CM

## 2022-11-13 DIAGNOSIS — E1169 Type 2 diabetes mellitus with other specified complication: Secondary | ICD-10-CM

## 2022-11-13 DIAGNOSIS — E785 Hyperlipidemia, unspecified: Secondary | ICD-10-CM

## 2022-11-13 DIAGNOSIS — Z9049 Acquired absence of other specified parts of digestive tract: Secondary | ICD-10-CM

## 2022-11-13 DIAGNOSIS — Z1211 Encounter for screening for malignant neoplasm of colon: Secondary | ICD-10-CM

## 2022-11-13 DIAGNOSIS — Z800 Family history of malignant neoplasm of digestive organs: Secondary | ICD-10-CM

## 2022-11-13 LAB — COMPREHENSIVE METABOLIC PANEL
ALT: 40 U/L (ref 0–53)
AST: 27 U/L (ref 0–37)
Albumin: 4.6 g/dL (ref 3.5–5.2)
Alkaline Phosphatase: 55 U/L (ref 39–117)
BUN: 14 mg/dL (ref 6–23)
CO2: 26 meq/L (ref 19–32)
Calcium: 9.7 mg/dL (ref 8.4–10.5)
Chloride: 103 meq/L (ref 96–112)
Creatinine, Ser: 1.13 mg/dL (ref 0.40–1.50)
GFR: 80.5 mL/min (ref >60.00)
Glucose, Bld: 126 mg/dL — ABNORMAL HIGH (ref 70–99)
Potassium: 3.8 meq/L (ref 3.5–5.1)
Sodium: 139 meq/L (ref 135–145)
Total Bilirubin: 0.7 mg/dL (ref 0.2–1.2)
Total Protein: 7.6 g/dL (ref 6.0–8.3)

## 2022-11-13 LAB — CBC
HCT: 49.8 % (ref 39.0–52.0)
Hemoglobin: 16.4 g/dL (ref 13.0–17.0)
MCHC: 32.8 g/dL (ref 30.0–36.0)
MCV: 89.6 fL (ref 78.0–100.0)
Platelets: 240 10*3/uL (ref 150.0–400.0)
RBC: 5.56 Mil/uL (ref 4.22–5.81)
RDW: 14.6 % (ref 11.5–15.5)
WBC: 5.9 10*3/uL (ref 4.0–10.5)

## 2022-11-13 LAB — TSH: TSH: 0.98 u[IU]/mL (ref 0.35–5.50)

## 2022-11-13 LAB — LIPID PANEL
Cholesterol: 160 mg/dL (ref 0–200)
HDL: 34.2 mg/dL — ABNORMAL LOW (ref >39.00)
LDL Cholesterol: 101 mg/dL — ABNORMAL HIGH (ref 0–99)
NonHDL: 125.38
Total CHOL/HDL Ratio: 5
Triglycerides: 124 mg/dL (ref 0.0–149.0)
VLDL: 24.8 mg/dL (ref 0.0–40.0)

## 2022-11-13 LAB — HEMOGLOBIN A1C: Hgb A1c MFr Bld: 6.4 % (ref 4.6–6.5)

## 2022-11-13 NOTE — Progress Notes (Signed)
Chief Complaint:  Kyle [name] is a 42 y.o. male who presents today for his annual comprehensive physical exam.    Assessment/Plan:  Chronic Problems Addressed Today: T2DM (type 2 diabetes mellitus) (HCC) Doing well Mounjaro 7.5 mg weekly.  Check A1c.  Come back in 3 to 6 months pending results.  Discussed lifestyle modifications.  Hypertension associated with diabetes (HCC) Controlled on lisinopril 10 mg daily.  Check labs.  Dyslipidemia associated with type 2 diabetes mellitus (HCC) Check lipids.  Not currently on any statins.  Will probably need to restart Lipitor depending on results.   Family history of colon cancer in mother Will refer for colonoscopy.   Status postcholecystectomy Doing well postoperatively. Still having some loose stools but this is improving.   Preventative Healthcare: Flu shot given today.  We will refer for colon cancer screening.  Check labs.  Patient Counseling(The following topics were reviewed and/or handout was given):  -Nutrition: Stressed importance of moderation in sodium/caffeine intake, saturated fat and cholesterol, caloric balance, sufficient intake of fresh fruits, vegetables, and fiber.  -Stressed the importance of regular exercise.   -Substance Abuse: Discussed cessation/primary prevention of tobacco, alcohol, or other drug use; driving or other dangerous activities under the influence; availability of treatment for abuse.   -Injury prevention: Discussed safety belts, safety helmets, smoke detector, smoking near bedding or upholstery.   -Sexuality: Discussed sexually transmitted diseases, partner selection, use of condoms, avoidance of unintended pregnancy and contraceptive alternatives.   -Dental health: Discussed importance of regular tooth brushing, flossing, and dental visits.  -Health maintenance and immunizations reviewed. Please refer to Health maintenance section.  Return to care in 1 year for next preventative visit.      Subjective:  HPI:  He has no acute complaints today. HE did have an emergency lap chole a couple of months ago.  He has had a lot of loose stools and diarrhea since then but this is improving.  Lifestyle Diet: Balanced. Plenty of fruits and vegetables.  Exercise: Limited.      11/13/2022    7:48 AM  Depression screen PHQ 2/9  Decreased Interest 0  Down, Depressed, Hopeless 0  PHQ - 2 Score 0    Health Maintenance Due  Topic Date Due   FOOT EXAM  Never done   OPHTHALMOLOGY EXAM  Never done   HIV Screening  Never done   Hepatitis C Screening  Never done   COVID-19 Vaccine (4 - 2023-24 season) 09/14/2022     ROS: Per HPI, otherwise a complete review of systems was negative.   PMH:  The following were reviewed and entered/updated in epic: Past Medical History:  Diagnosis Date   Diabetes mellitus without complication (HCC)    Hypertension    Lumbar herniated disc    Pilonidal disease    Psoriasis    Sciatica 2017   Left   Wears contact lenses    Wears glasses    Patient Active Problem List   Diagnosis Date Noted   Family history of colon cancer in mother 11/13/2022   S/P cholecystectomy 11/13/2022   T2DM (type 2 diabetes mellitus) (HCC) 05/13/2022   Nephrolithiasis 05/13/2022   Dyslipidemia associated with type 2 diabetes mellitus (HCC) 05/13/2022   Sleep-disordered breathing 05/13/2022   Lumbar degenerative disc disease 04/05/2018   Hypertension associated with diabetes (HCC) 11/19/2015    Class: Chronic   Pilonidal disease s/p excision 12/04/2017 11/19/2015   Past Surgical History:  Procedure Laterality Date   ABCESS DRAINAGE  Pilonidal cyst   CHOLECYSTECTOMY N/A 08/30/2022   Procedure: LAPAROSCOPIC CHOLECYSTECTOMY;  Surgeon: Quentin Ore, MD;  Location: [facility] OR;  Service: General;  Laterality: N/A;   LUMBAR LAMINECTOMY/DECOMPRESSION MICRODISCECTOMY Left 04/08/2018   Procedure: Left L5-S1 discectomy;  Surgeon: Venita Lick, MD;  Location: [facility] OR;   Service: Orthopedics;  Laterality: Left;  120 mins   PILONIDAL CYST EXCISION N/A 12/04/2017   Procedure: EXCISION OF PILONIDAL DISEASE;  Surgeon: Karie Soda, MD;  Location: [facility];  Service: General;  Laterality: N/A;   WISDOM TOOTH EXTRACTION      Family History  Problem Relation Age of Onset   Colon cancer Mother    Hypertension Father    Diabetes Father    Kidney cancer Father    Inflammatory bowel disease Sister     Medications- reviewed and updated Current Outpatient Medications  Medication Sig Dispense Refill   lisinopril (ZESTRIL) 10 MG tablet Take 1 tablet (10 mg total) by mouth in the morning. 90 tablet 1   tirzepatide (MOUNJARO) 7.5 MG/0.5ML Pen Inject 7.5 mg into the skin once a week. 2 mL 0   No current facility-administered medications for this visit.    Allergies-reviewed and updated No Known Allergies  Social History   Socioeconomic History   Marital status: Married    Spouse name: Not on file   Number of children: Not on file   Years of education: Not on file   Highest education level: Some college, no degree  Occupational History   Not on file  Tobacco Use   Smoking status: Never   Smokeless tobacco: Never  Vaping Use   Vaping status: Never Used  Substance and Sexual Activity   Alcohol use: Not Currently    Comment: "hardly ever" beer every now and then   Drug use: No   Sexual activity: Not on file  Other Topics Concern   Not on file  Social History Narrative   ** Merged History Encounter **       Social Determinants of Health   Financial Resource Strain: Low Risk  (08/08/2022)   Overall Financial Resource Strain (CARDIA)    Difficulty of Paying Living Expenses: Not very hard  Food Insecurity: No Food Insecurity (08/08/2022)   Hunger Vital Sign    Worried About Running Out of Food in the Last Year: Never true    Ran Out of Food in the Last Year: Never true  Transportation Needs: No Transportation Needs (08/08/2022)    PRAPARE - Administrator, Civil Service (Medical): No    Lack of Transportation (Non-Medical): No  Physical Activity: Unknown (08/08/2022)   Exercise Vital Sign    Days of Exercise per Week: 0 days    Minutes of Exercise per Session: Not on file  Stress: No Stress Concern Present (08/08/2022)   [employer] of Occupational Health - Occupational Stress Questionnaire    Feeling of Stress : Only a little  Social Connections: Socially Isolated (08/08/2022)   Social Connection and Isolation Panel [NHANES]    Frequency of Communication with Friends and Family: Once a week    Frequency of Social Gatherings with Friends and Family: Once a week    Attends Religious Services: Never    Database administrator or Organizations: No    Attends Engineer, structural: Not on file    Marital Status: Married        Objective:  Physical Exam: BP 111/79   Pulse 79   Temp  97.8 F (36.6 C) (Temporal)   Resp (!) 97   Ht 6\' 3"  (1.905 m)   Wt 295 lb 12.8 oz (134.2 kg)   BMI 36.97 kg/m   Body mass index is 36.97 kg/m. Wt Readings from Last 3 Encounters:  11/13/22 295 lb 12.8 oz (134.2 kg)  10/07/22 295 lb (133.8 kg)  08/30/22 (!) 301 lb (136.5 kg)   Gen: NAD, resting comfortably HEENT: TMs normal bilaterally. OP clear. No thyromegaly noted.  CV: RRR with no murmurs appreciated Pulm: NWOB, CTAB with no crackles, wheezes, or rhonchi GI: Normal bowel sounds present. Soft, Nontender, Nondistended. MSK: no edema, cyanosis, or clubbing noted Skin: warm, dry Neuro: CN2-12 grossly intact. Strength 5/5 in upper and lower extremities. Reflexes symmetric and intact bilaterally.  Psych: Normal affect and thought content     Chyna Kneece M. Jimmey Ralph, MD 11/13/2022 8:31 AM

## 2022-11-13 NOTE — Assessment & Plan Note (Signed)
Controlled on lisinopril 10 mg daily.  Check labs.

## 2022-11-13 NOTE — Assessment & Plan Note (Signed)
Doing well Mounjaro 7.5 mg weekly.  Check A1c.  Come back in 3 to 6 months pending results.  Discussed lifestyle modifications.

## 2022-11-13 NOTE — Assessment & Plan Note (Addendum)
Will refer for colonoscopy

## 2022-11-13 NOTE — Patient Instructions (Addendum)
It was very nice to see you today!  We will check blood work today.  We gave your flu shot today.  Will refer you for colon cancer screening.  Will see you back in 6 months.  Coming to see [place] sooner if needed.  Return in about 6 months (around 05/13/2023).   Take care, Dr Jimmey Ralph  PLEASE NOTE:  If you had any lab tests, please let us know if you have not heard back within a few days. You may see your results on mychart before we have a chance to review them but we will give you a call once they are reviewed by [place].   If we ordered any referrals today, please let us know if you have not heard from their office within the next week.   If you had any urgent prescriptions sent in today, please check with the pharmacy within an hour of our visit to make sure the prescription was transmitted appropriately.   Please try these tips to maintain a healthy lifestyle:  Eat at least 3 REAL meals and 1-2 snacks per day.  Aim for no more than 5 hours between eating.  If you eat breakfast, please do so within one hour of getting up.   Each meal should contain half fruits/vegetables, one quarter protein, and one quarter carbs (no bigger than a computer mouse)  Cut down on sweet beverages. This includes juice, soda, and sweet tea.   Drink at least 1 glass of water with each meal and aim for at least 8 glasses per day  Exercise at least 150 minutes every week.    Preventive Care 13-17 Years Old, Male Preventive care refers to lifestyle choices and visits with your health care provider that can promote health and wellness. Preventive care visits are also called wellness exams. What can I expect for my preventive care visit? Counseling During your preventive care visit, your health care provider may ask about your: Medical history, including: Past medical problems. Family medical history. Current health, including: Emotional well-being. Home life and relationship well-being. Sexual  activity. Lifestyle, including: Alcohol, nicotine or tobacco, and drug use. Access to firearms. Diet, exercise, and sleep habits. Safety issues such as seatbelt and bike helmet use. Sunscreen use. Work and work Astronomer. Physical exam Your health care provider will check your: Height and weight. These may be used to calculate your BMI (body mass index). BMI is a measurement that tells if you are at a healthy weight. Waist circumference. This measures the distance around your waistline. This measurement also tells if you are at a healthy weight and may help predict your risk of certain diseases, such as type 2 diabetes and high blood pressure. Heart rate and blood pressure. Body temperature. Skin for abnormal spots. What immunizations do I need?  Vaccines are usually given at various ages, according to a schedule. Your health care provider will recommend vaccines for you based on your age, medical history, and lifestyle or other factors, such as travel or where you work. What tests do I need? Screening Your health care provider may recommend screening tests for certain conditions. This may include: Lipid and cholesterol levels. Diabetes screening. This is done by checking your blood sugar (glucose) after you have not eaten for a while (fasting). Hepatitis B test. Hepatitis C test. HIV (human immunodeficiency virus) test. STI (sexually transmitted infection) testing, if you are at risk. Lung cancer screening. Prostate cancer screening. Colorectal cancer screening. Talk with your health care provider about your  test results, treatment options, and if necessary, the need for more tests. Follow these instructions at home: Eating and drinking  Eat a diet that includes fresh fruits and vegetables, whole grains, lean protein, and low-fat dairy products. Take vitamin and mineral supplements as recommended by your health care provider. Do not drink alcohol if your health care provider  tells you not to drink. If you drink alcohol: Limit how much you have to 0-2 drinks a day. Know how much alcohol is in your drink. In the [place] one drink equals one 12 oz bottle of beer (355 mL), one 5 oz glass of wine (148 mL), or one 1 oz glass of hard liquor (44 mL). Lifestyle Brush your teeth every morning and night with fluoride toothpaste. Floss one time each day. Exercise for at least 30 minutes 5 or more days each week. Do not use any products that contain nicotine or tobacco. These products include cigarettes, chewing tobacco, and vaping devices, such as e-cigarettes. If you need help quitting, ask your health care provider. Do not use drugs. If you are sexually active, practice safe sex. Use a condom or other form of protection to prevent STIs. Take aspirin only as told by your health care provider. Make sure that you understand how much to take and what form to take. Work with your health care provider to find out whether it is safe and beneficial for you to take aspirin daily. Find healthy ways to manage stress, such as: Meditation, yoga, or listening to music. Journaling. Talking to a trusted person. Spending time with friends and family. Minimize exposure to UV radiation to reduce your risk of skin cancer. Safety Always wear your seat belt while driving or riding in a vehicle. Do not drive: If you have been drinking alcohol. Do not ride with someone who has been drinking. When you are tired or distracted. While texting. If you have been using any mind-altering substances or drugs. Wear a helmet and other protective equipment during sports activities. If you have firearms in your house, make sure you follow all gun safety procedures. What's next? Go to your health care provider once a year for an annual wellness visit. Ask your health care provider how often you should have your eyes and teeth checked. Stay up to date on all vaccines. This information is not intended to  replace advice given to you by your health care provider. Make sure you discuss any questions you have with your health care provider. Document Revised: 06/27/2020 Document Reviewed: 06/27/2020 Elsevier Patient Education  2024 [employer].

## 2022-11-13 NOTE — Assessment & Plan Note (Addendum)
Check lipids.  Not currently on any statins.  Will probably need to restart Lipitor depending on results.

## 2022-11-14 LAB — HIV ANTIBODY (ROUTINE TESTING W REFLEX): HIV 1&2 Ab, 4th Generation: NONREACTIVE

## 2022-11-14 LAB — HEPATITIS C ANTIBODY: Hepatitis C Ab: NONREACTIVE

## 2022-11-14 NOTE — Progress Notes (Signed)
His A1c looks much better than last time.  He can continue current dose of Mounjaro and we can recheck in 3 to 6 months.  Cholesterol looks much better than last time as well.  He may benefit from starting low-dose statin to optimize his numbers.  Please send in simvastatin 20 mg daily if he is agreeable.  Regardless he should continue to work on diet and exercise and we can recheck in 6 to 12 months. The rest of his labs are all stable and we can recheck everything else in a year.

## 2022-11-23 ENCOUNTER — Other Ambulatory Visit: Payer: Self-pay | Admitting: Family Medicine

## 2022-11-24 ENCOUNTER — Other Ambulatory Visit: Payer: Self-pay

## 2022-11-25 ENCOUNTER — Other Ambulatory Visit (HOSPITAL_COMMUNITY): Payer: Self-pay

## 2022-11-25 MED ORDER — MOUNJARO 7.5 MG/0.5ML ~~LOC~~ SOAJ
7.5000 mg | SUBCUTANEOUS | 0 refills | Status: DC
  Filled 2022-11-25: qty 2, 28d supply, fill #0

## 2022-11-28 ENCOUNTER — Other Ambulatory Visit: Payer: Self-pay

## 2022-12-23 ENCOUNTER — Other Ambulatory Visit: Payer: Self-pay | Admitting: Family Medicine

## 2022-12-24 ENCOUNTER — Other Ambulatory Visit (HOSPITAL_COMMUNITY): Payer: Self-pay

## 2022-12-24 ENCOUNTER — Other Ambulatory Visit: Payer: Self-pay

## 2022-12-24 MED ORDER — MOUNJARO 7.5 MG/0.5ML ~~LOC~~ SOAJ
7.5000 mg | SUBCUTANEOUS | 0 refills | Status: DC
  Filled 2022-12-24: qty 2, 28d supply, fill #0

## 2022-12-25 ENCOUNTER — Encounter: Payer: Self-pay | Admitting: Gastroenterology

## 2023-01-06 LAB — HM DIABETES EYE EXAM

## 2023-01-22 ENCOUNTER — Other Ambulatory Visit: Payer: Self-pay | Admitting: Family Medicine

## 2023-01-23 ENCOUNTER — Other Ambulatory Visit (HOSPITAL_COMMUNITY): Payer: Self-pay

## 2023-01-23 MED ORDER — MOUNJARO 7.5 MG/0.5ML ~~LOC~~ SOAJ
7.5000 mg | SUBCUTANEOUS | 0 refills | Status: DC
  Filled 2023-01-23: qty 2, 28d supply, fill #0

## 2023-01-25 ENCOUNTER — Other Ambulatory Visit (HOSPITAL_COMMUNITY): Payer: Self-pay

## 2023-01-26 ENCOUNTER — Other Ambulatory Visit (HOSPITAL_COMMUNITY): Payer: Self-pay

## 2023-01-26 ENCOUNTER — Encounter: Payer: Self-pay | Admitting: Pharmacist

## 2023-01-26 ENCOUNTER — Other Ambulatory Visit: Payer: Self-pay

## 2023-02-04 ENCOUNTER — Other Ambulatory Visit (HOSPITAL_COMMUNITY): Payer: Self-pay

## 2023-02-10 ENCOUNTER — Other Ambulatory Visit (HOSPITAL_BASED_OUTPATIENT_CLINIC_OR_DEPARTMENT_OTHER): Payer: Self-pay

## 2023-02-10 ENCOUNTER — Other Ambulatory Visit: Payer: Self-pay | Admitting: Family Medicine

## 2023-02-10 ENCOUNTER — Other Ambulatory Visit (HOSPITAL_COMMUNITY): Payer: Self-pay

## 2023-02-10 ENCOUNTER — Other Ambulatory Visit: Payer: Self-pay

## 2023-02-10 MED ORDER — LISINOPRIL 10 MG PO TABS
10.0000 mg | ORAL_TABLET | Freq: Every morning | ORAL | 1 refills | Status: DC
  Filled 2023-02-10: qty 30, 30d supply, fill #0
  Filled 2023-03-08: qty 30, 30d supply, fill #1
  Filled 2023-04-07: qty 30, 30d supply, fill #2
  Filled 2023-05-07: qty 30, 30d supply, fill #3
  Filled 2023-06-02: qty 30, 30d supply, fill #4
  Filled 2023-07-01: qty 30, 30d supply, fill #5

## 2023-02-11 ENCOUNTER — Other Ambulatory Visit (HOSPITAL_COMMUNITY): Payer: Self-pay

## 2023-02-17 ENCOUNTER — Other Ambulatory Visit: Payer: Self-pay | Admitting: Family Medicine

## 2023-02-18 ENCOUNTER — Other Ambulatory Visit: Payer: Self-pay

## 2023-02-18 ENCOUNTER — Other Ambulatory Visit (HOSPITAL_COMMUNITY): Payer: Self-pay

## 2023-02-18 MED ORDER — MOUNJARO 7.5 MG/0.5ML ~~LOC~~ SOAJ
7.5000 mg | SUBCUTANEOUS | 0 refills | Status: DC
  Filled 2023-02-18: qty 2, 28d supply, fill #0

## 2023-03-08 ENCOUNTER — Other Ambulatory Visit: Payer: Self-pay | Admitting: Family Medicine

## 2023-03-09 ENCOUNTER — Other Ambulatory Visit: Payer: Self-pay

## 2023-03-09 ENCOUNTER — Other Ambulatory Visit (HOSPITAL_COMMUNITY): Payer: Self-pay

## 2023-03-16 ENCOUNTER — Ambulatory Visit: Payer: Managed Care, Other (non HMO) | Admitting: Gastroenterology

## 2023-03-18 ENCOUNTER — Other Ambulatory Visit: Payer: Self-pay | Admitting: Family Medicine

## 2023-03-19 ENCOUNTER — Other Ambulatory Visit (HOSPITAL_COMMUNITY): Payer: Self-pay

## 2023-03-19 MED ORDER — MOUNJARO 7.5 MG/0.5ML ~~LOC~~ SOAJ
7.5000 mg | SUBCUTANEOUS | 0 refills | Status: DC
  Filled 2023-03-19: qty 2, 28d supply, fill #0

## 2023-04-13 ENCOUNTER — Other Ambulatory Visit: Payer: Self-pay | Admitting: Family Medicine

## 2023-04-14 ENCOUNTER — Other Ambulatory Visit: Payer: Self-pay

## 2023-04-14 ENCOUNTER — Other Ambulatory Visit (HOSPITAL_COMMUNITY): Payer: Self-pay

## 2023-04-14 MED ORDER — MOUNJARO 7.5 MG/0.5ML ~~LOC~~ SOAJ
7.5000 mg | SUBCUTANEOUS | 0 refills | Status: DC
  Filled 2023-04-14: qty 2, 28d supply, fill #0

## 2023-05-13 ENCOUNTER — Ambulatory Visit: Payer: Managed Care, Other (non HMO) | Admitting: Family Medicine

## 2023-05-13 ENCOUNTER — Encounter: Payer: Self-pay | Admitting: Family Medicine

## 2023-05-13 ENCOUNTER — Other Ambulatory Visit: Payer: Self-pay | Admitting: Family Medicine

## 2023-05-13 VITALS — BP 122/87 | HR 76 | Temp 97.2°F | Ht 75.0 in | Wt 288.8 lb

## 2023-05-13 DIAGNOSIS — Z7985 Long-term (current) use of injectable non-insulin antidiabetic drugs: Secondary | ICD-10-CM

## 2023-05-13 DIAGNOSIS — E1159 Type 2 diabetes mellitus with other circulatory complications: Secondary | ICD-10-CM

## 2023-05-13 DIAGNOSIS — Z23 Encounter for immunization: Secondary | ICD-10-CM | POA: Diagnosis not present

## 2023-05-13 DIAGNOSIS — E1169 Type 2 diabetes mellitus with other specified complication: Secondary | ICD-10-CM | POA: Diagnosis not present

## 2023-05-13 DIAGNOSIS — I152 Hypertension secondary to endocrine disorders: Secondary | ICD-10-CM

## 2023-05-13 LAB — POCT GLYCOSYLATED HEMOGLOBIN (HGB A1C): Hemoglobin A1C: 5.7 % — AB (ref 4.0–5.6)

## 2023-05-13 LAB — MICROALBUMIN / CREATININE URINE RATIO
Creatinine,U: 209.1 mg/dL
Microalb Creat Ratio: UNDETERMINED mg/g (ref 0.0–30.0)
Microalb, Ur: 0.7 mg/dL (ref 0.0–1.9)

## 2023-05-13 NOTE — Assessment & Plan Note (Signed)
 Sugar very well-controlled with A1c 5.7 on Mounjaro  7.5 mg weekly.  He is tolerating well.  Will continue current regimen.  Follow-up in 6 months at CPE.

## 2023-05-13 NOTE — Assessment & Plan Note (Signed)
At goal today on lisinopril 10mg daily.  

## 2023-05-13 NOTE — Patient Instructions (Signed)
 It was very nice to see you today!  Your blood sugar looks great.  We will continue your current medications.  I believe you have a slight sprain in the shoulder.  Please rest for the next 1 to 2 weeks.  Use ice and over-the-counter anti-inflammatories as needed.  Let us  know if not improving.  Return in about 6 months (around 11/12/2023) for Annual Physical.   Take care, Dr Daneil Dunker  PLEASE NOTE:  If you had any lab tests, please let us  know if you have not heard back within a few days. You may see your results on mychart before we have a chance to review them but we will give you a call once they are reviewed by us .   If we ordered any referrals today, please let us  know if you have not heard from their office within the next week.   If you had any urgent prescriptions sent in today, please check with the pharmacy within an hour of our visit to make sure the prescription was transmitted appropriately.   Please try these tips to maintain a healthy lifestyle:  Eat at least 3 REAL meals and 1-2 snacks per day.  Aim for no more than 5 hours between eating.  If you eat breakfast, please do so within one hour of getting up.   Each meal should contain half fruits/vegetables, one quarter protein, and one quarter carbs (no bigger than a computer mouse)  Cut down on sweet beverages. This includes juice, soda, and sweet tea.   Drink at least 1 glass of water with each meal and aim for at least 8 glasses per day  Exercise at least 150 minutes every week.

## 2023-05-13 NOTE — Progress Notes (Signed)
   Kyle [name] is a 44 y.o. male who presents today for an office visit.  Assessment/Plan:  New/Acute Problems: Left Shoulder Pain  Exam consistent with AC joint sprain.  No red flag signs or symptoms.  Symptoms are currently mild and manageable.  We discussed conservative measures.  He will avoid any precipitating or aggravating exercises for the next couple of weeks.  Recommended ice and NSAIDs.  He will let us  know if not improving we can refer to PT or sports medicine.   Chronic Problems Addressed Today: Hypertension associated with diabetes (HCC) At goal today on lisinopril  10 mg daily.  T2DM (type 2 diabetes mellitus) (HCC) Sugar very well-controlled with A1c 5.7 on Mounjaro  7.5 mg weekly.  He is tolerating well.  Will continue current regimen.  Follow-up in 6 months at CPE.     Subjective:  HPI:  See A/P for status of chronic conditions.  Patient is here today for follow-up.  Last saw him about 6 months ago for an annual physical.  A1c at that time was 6.4 on Mounjaro  7.5 mg weekly.  He has been having a little more shoulder pain for the last week or so. No obvious injury or precipitating events.  He has been exercising more and thinks he that he may have tweaked something.  No treatments tried.  Tried using benchpress the other day which exacerbated his pain.       Objective:  Physical Exam: BP 122/87   Pulse 76   Temp (!) 97.2 F (36.2 C) (Temporal)   Ht 6\' 3"  (1.905 m)   Wt 288 lb 12.8 oz (131 kg)   SpO2 97%   BMI 36.10 kg/m   Wt Readings from Last 3 Encounters:  05/13/23 288 lb 12.8 oz (131 kg)  11/13/22 295 lb 12.8 oz (134.2 kg)  10/07/22 295 lb (133.8 kg)  Gen: No acute distress, resting comfortably CV: Regular rate and rhythm with no murmurs appreciated Pulm: Normal work of breathing, clear to auscultation bilaterally with no crackles, wheezes, or rhonchi MUSCULOSKELETAL: - Left Shoulder: No deformities.  Tender to palpation over left AC joint.  Pain  elicited with crossover test.  Neurovascular intact distally.  Full range of motion. Neuro: Grossly normal, moves all extremities Psych: Normal affect and thought content      Kyle [name] M. Daneil Dunker, MD 05/13/2023 7:49 AM

## 2023-05-14 ENCOUNTER — Other Ambulatory Visit (HOSPITAL_COMMUNITY): Payer: Self-pay

## 2023-05-14 MED ORDER — MOUNJARO 7.5 MG/0.5ML ~~LOC~~ SOAJ
7.5000 mg | SUBCUTANEOUS | 0 refills | Status: DC
  Filled 2023-05-14: qty 2, 28d supply, fill #0

## 2023-05-15 ENCOUNTER — Encounter: Payer: Self-pay | Admitting: Family Medicine

## 2023-05-15 NOTE — Progress Notes (Signed)
 He has no detectable protein in his urine.

## 2023-06-13 ENCOUNTER — Other Ambulatory Visit: Payer: Self-pay | Admitting: Family Medicine

## 2023-06-15 ENCOUNTER — Other Ambulatory Visit (HOSPITAL_COMMUNITY): Payer: Self-pay

## 2023-06-15 MED ORDER — MOUNJARO 7.5 MG/0.5ML ~~LOC~~ SOAJ
7.5000 mg | SUBCUTANEOUS | 0 refills | Status: DC
  Filled 2023-06-15: qty 2, 28d supply, fill #0

## 2023-06-30 NOTE — Telephone Encounter (Signed)
 We received a fax from [employer] stating [employer] cannot process request because pre certification is not provided through [employer].   I called [employer]. They have been working on auth/denial.   I called [employer]. There was no details on letter about what pre-certification was for. I spoke with a staff member who said this was regarding cpap. I have faxed the letter over to [employer] for them to address. Received a receipt of confirmation.

## 2023-07-19 ENCOUNTER — Other Ambulatory Visit: Payer: Self-pay | Admitting: Family Medicine

## 2023-07-20 ENCOUNTER — Other Ambulatory Visit: Payer: Self-pay

## 2023-07-20 ENCOUNTER — Other Ambulatory Visit (HOSPITAL_COMMUNITY): Payer: Self-pay

## 2023-07-20 MED ORDER — MOUNJARO 7.5 MG/0.5ML ~~LOC~~ SOAJ
7.5000 mg | SUBCUTANEOUS | 0 refills | Status: DC
  Filled 2023-07-20: qty 2, 28d supply, fill #0

## 2023-08-14 ENCOUNTER — Other Ambulatory Visit (HOSPITAL_COMMUNITY): Payer: Self-pay

## 2023-08-14 ENCOUNTER — Other Ambulatory Visit: Payer: Self-pay

## 2023-08-14 ENCOUNTER — Other Ambulatory Visit: Payer: Self-pay | Admitting: Family Medicine

## 2023-08-14 MED ORDER — MOUNJARO 7.5 MG/0.5ML ~~LOC~~ SOAJ
7.5000 mg | SUBCUTANEOUS | 0 refills | Status: DC
  Filled 2023-08-14: qty 2, 28d supply, fill #0

## 2023-08-14 MED ORDER — LISINOPRIL 10 MG PO TABS
10.0000 mg | ORAL_TABLET | Freq: Every morning | ORAL | 1 refills | Status: DC
  Filled 2023-08-14: qty 30, 30d supply, fill #0
  Filled 2023-09-08: qty 30, 30d supply, fill #1
  Filled 2023-11-06: qty 30, 30d supply, fill #2
  Filled 2023-11-17: qty 30, 30d supply, fill #3
  Filled 2023-12-12: qty 30, 30d supply, fill #4
  Filled 2023-12-19: qty 30, 30d supply, fill #5

## 2023-09-08 ENCOUNTER — Other Ambulatory Visit: Payer: Self-pay

## 2023-09-08 ENCOUNTER — Other Ambulatory Visit (HOSPITAL_COMMUNITY): Payer: Self-pay

## 2023-09-08 ENCOUNTER — Other Ambulatory Visit: Payer: Self-pay | Admitting: Family Medicine

## 2023-09-08 ENCOUNTER — Encounter (HOSPITAL_COMMUNITY): Payer: Self-pay

## 2023-09-10 ENCOUNTER — Ambulatory Visit (INDEPENDENT_AMBULATORY_CARE_PROVIDER_SITE_OTHER): Admitting: Family Medicine

## 2023-09-10 ENCOUNTER — Encounter: Payer: Self-pay | Admitting: Family Medicine

## 2023-09-10 VITALS — BP 119/84 | HR 80 | Temp 97.5°F | Ht 75.0 in | Wt 281.0 lb

## 2023-09-10 DIAGNOSIS — Z7985 Long-term (current) use of injectable non-insulin antidiabetic drugs: Secondary | ICD-10-CM | POA: Diagnosis not present

## 2023-09-10 DIAGNOSIS — E1169 Type 2 diabetes mellitus with other specified complication: Secondary | ICD-10-CM | POA: Diagnosis not present

## 2023-09-10 DIAGNOSIS — I152 Hypertension secondary to endocrine disorders: Secondary | ICD-10-CM | POA: Diagnosis not present

## 2023-09-10 DIAGNOSIS — E1159 Type 2 diabetes mellitus with other circulatory complications: Secondary | ICD-10-CM | POA: Diagnosis not present

## 2023-09-10 LAB — POCT GLYCOSYLATED HEMOGLOBIN (HGB A1C): Hemoglobin A1C: 5.6 % (ref 4.0–5.6)

## 2023-09-10 NOTE — Progress Notes (Signed)
   Kyle [name] is a 43 y.o. male who presents today for an office visit.  Assessment/Plan:  Chronic Problems Addressed Today: Hypertension associated with diabetes (HCC) At goal today on lisinopril  10 mg daily.  T2DM (type 2 diabetes mellitus) (HCC) A1c very well-controlled 5.6 on Mounjaro  7.5 mg weekly.  Tolerating well without significant side effects.  He is down about 7 pounds since our last visit.  We did discuss increasing this however he is comfortable with where he is for now.  Will continue Mounjaro  7.5 mg weekly.  Recheck in 3 months at CPE.     Subjective:  HPI:  See assessment / plan for status of chronic conditions.   Discussed the use of AI scribe software for clinical note transcription with the patient, who gave verbal consent to proceed.  History of Present Illness Kyle [name] is a 42 year old male with diabetes who presents for follow-up on blood sugar control.  He is currently on a 7.5 mg dose of Mounjaro , which effectively manages his appetite and aids in weight loss without any side effects. His recent finger stick blood sugar level was 5.6.  He has lost approximately seven pounds and five inches off his waistline since his last visit, attributing this to his regular exercise routine, which includes using the elliptical and free weights three times a week. He notes a positive change in body habitus.          Objective:  Physical Exam: BP 119/84   Pulse 80   Temp (!) 97.5 F (36.4 C) (Temporal)   Ht 6' 3 (1.905 m)   Wt 281 lb (127.5 kg)   SpO2 97%   BMI 35.12 kg/m   Wt Readings from Last 3 Encounters:  09/10/23 281 lb (127.5 kg)  05/13/23 288 lb 12.8 oz (131 kg)  11/13/22 295 lb 12.8 oz (134.2 kg)    Gen: No acute distress, resting comfortably CV: Regular rate and rhythm with no murmurs appreciated Pulm: Normal work of breathing, clear to auscultation bilaterally with no crackles, wheezes, or rhonchi Neuro: Grossly normal, moves all  extremities Psych: Normal affect and thought content      Thadd Apuzzo M. Kennyth, MD 09/10/2023 8:36 AM

## 2023-09-10 NOTE — Patient Instructions (Signed)
 It was very nice to see you today!  VISIT SUMMARY: You came in for a follow-up on your blood sugar control. Your diabetes is well-managed with your current medication and lifestyle changes, and your recent blood sugar levels are excellent.  YOUR PLAN: TYPE 2 DIABETES MELLITUS: Your diabetes is well-controlled with your current medication and lifestyle changes. -Your A1c today is 5.6 -Continue taking Mounjaro  7.5 mg as prescribed. -Keep up with your regular exercise routine, including using the elliptical and free weights three times a week. -We will reassess your diabetes management and A1c levels in November. -Please contact our office if you have any issues or questions.  Return in about 3 months (around 12/11/2023) for Annual Physical.   Take care, Dr Kennyth  PLEASE NOTE:  If you had any lab tests, please let us  know if you have not heard back within a few days. You may see your results on mychart before we have a chance to review them but we will give you a call once they are reviewed by us .   If we ordered any referrals today, please let us  know if you have not heard from their office within the next week.   If you had any urgent prescriptions sent in today, please check with the pharmacy within an hour of our visit to make sure the prescription was transmitted appropriately.   Please try these tips to maintain a healthy lifestyle:  Eat at least 3 REAL meals and 1-2 snacks per day.  Aim for no more than 5 hours between eating.  If you eat breakfast, please do so within one hour of getting up.   Each meal should contain half fruits/vegetables, one quarter protein, and one quarter carbs (no bigger than a computer mouse)  Cut down on sweet beverages. This includes juice, soda, and sweet tea.   Drink at least 1 glass of water with each meal and aim for at least 8 glasses per day  Exercise at least 150 minutes every week.

## 2023-09-10 NOTE — Assessment & Plan Note (Signed)
 A1c very well-controlled 5.6 on Mounjaro  7.5 mg weekly.  Tolerating well without significant side effects.  He is down about 7 pounds since our last visit.  We did discuss increasing this however he is comfortable with where he is for now.  Will continue Mounjaro  7.5 mg weekly.  Recheck in 3 months at CPE.

## 2023-09-10 NOTE — Assessment & Plan Note (Signed)
At goal today on lisinopril 10mg daily.  

## 2023-09-21 ENCOUNTER — Other Ambulatory Visit: Payer: Self-pay | Admitting: Family Medicine

## 2023-09-21 NOTE — Telephone Encounter (Unsigned)
 Copied from CRM [id number]. Topic: Clinical - Medication Refill >> Sep 21, 2023  3:40 PM [place] E wrote: Medication: tirzepatide  (MOUNJARO ) 7.5 MG/0.5ML Pen  Has the patient contacted their pharmacy? Yes (Agent: If no, request that the patient contact the pharmacy for the refill. If patient does not wish to contact the pharmacy document the reason why and proceed with request.) (Agent: If yes, when and what did the pharmacy advise?)  This is the patient's preferred pharmacy:  [facility] [address] [place] [address] Phone: [phone] Fax: [phone]   Is this the correct pharmacy for this prescription? Yes If no, delete pharmacy and type the correct one.   Has the prescription been filled recently? No  Is the patient out of the medication? Yes  Has the patient been seen for an appointment in the last year OR does the patient have an upcoming appointment? Yes  Can we respond through MyChart? Yes  Agent: Please be advised that Rx refills may take up to 3 business days. We ask that you follow-up with your pharmacy.

## 2023-09-22 ENCOUNTER — Other Ambulatory Visit (HOSPITAL_COMMUNITY): Payer: Self-pay

## 2023-09-22 ENCOUNTER — Other Ambulatory Visit: Payer: Self-pay

## 2023-09-22 MED ORDER — MOUNJARO 7.5 MG/0.5ML ~~LOC~~ SOAJ
7.5000 mg | SUBCUTANEOUS | 0 refills | Status: DC
  Filled 2023-09-22: qty 2, 28d supply, fill #0

## 2023-10-07 ENCOUNTER — Ambulatory Visit: Payer: Managed Care, Other (non HMO) | Admitting: Adult Health

## 2023-10-18 ENCOUNTER — Other Ambulatory Visit: Payer: Self-pay | Admitting: Family Medicine

## 2023-10-19 ENCOUNTER — Other Ambulatory Visit: Payer: Self-pay

## 2023-10-19 MED ORDER — MOUNJARO 7.5 MG/0.5ML ~~LOC~~ SOAJ
7.5000 mg | SUBCUTANEOUS | 0 refills | Status: DC
  Filled 2023-10-19: qty 2, 28d supply, fill #0

## 2023-11-08 ENCOUNTER — Other Ambulatory Visit: Payer: Self-pay | Admitting: Family Medicine

## 2023-11-09 ENCOUNTER — Other Ambulatory Visit (HOSPITAL_COMMUNITY): Payer: Self-pay

## 2023-11-09 MED ORDER — MOUNJARO 7.5 MG/0.5ML ~~LOC~~ SOAJ
7.5000 mg | SUBCUTANEOUS | 0 refills | Status: DC
  Filled 2023-11-09: qty 2, 28d supply, fill #0

## 2023-11-10 ENCOUNTER — Other Ambulatory Visit: Payer: Self-pay | Admitting: Medical Genetics

## 2023-11-11 ENCOUNTER — Other Ambulatory Visit: Payer: Self-pay

## 2023-11-11 ENCOUNTER — Other Ambulatory Visit (HOSPITAL_COMMUNITY): Payer: Self-pay

## 2023-11-17 ENCOUNTER — Encounter: Payer: Self-pay | Admitting: Family Medicine

## 2023-11-17 ENCOUNTER — Other Ambulatory Visit (HOSPITAL_BASED_OUTPATIENT_CLINIC_OR_DEPARTMENT_OTHER): Payer: Self-pay

## 2023-11-17 ENCOUNTER — Ambulatory Visit: Admitting: Family Medicine

## 2023-11-17 ENCOUNTER — Other Ambulatory Visit: Payer: Self-pay

## 2023-11-17 ENCOUNTER — Other Ambulatory Visit (HOSPITAL_COMMUNITY): Payer: Self-pay

## 2023-11-17 VITALS — BP 120/80 | HR 81 | Temp 97.2°F | Ht 75.0 in | Wt 286.8 lb

## 2023-11-17 DIAGNOSIS — E1159 Type 2 diabetes mellitus with other circulatory complications: Secondary | ICD-10-CM

## 2023-11-17 DIAGNOSIS — I152 Hypertension secondary to endocrine disorders: Secondary | ICD-10-CM

## 2023-11-17 DIAGNOSIS — E785 Hyperlipidemia, unspecified: Secondary | ICD-10-CM | POA: Diagnosis not present

## 2023-11-17 DIAGNOSIS — Z7985 Long-term (current) use of injectable non-insulin antidiabetic drugs: Secondary | ICD-10-CM

## 2023-11-17 DIAGNOSIS — Z0001 Encounter for general adult medical examination with abnormal findings: Secondary | ICD-10-CM

## 2023-11-17 DIAGNOSIS — Z23 Encounter for immunization: Secondary | ICD-10-CM

## 2023-11-17 DIAGNOSIS — E1169 Type 2 diabetes mellitus with other specified complication: Secondary | ICD-10-CM | POA: Diagnosis not present

## 2023-11-17 DIAGNOSIS — Z800 Family history of malignant neoplasm of digestive organs: Secondary | ICD-10-CM

## 2023-11-17 LAB — COMPREHENSIVE METABOLIC PANEL WITH GFR
ALT: 41 U/L (ref 0–53)
AST: 30 U/L (ref 0–37)
Albumin: 4.5 g/dL (ref 3.5–5.2)
Alkaline Phosphatase: 59 U/L (ref 39–117)
BUN: 18 mg/dL (ref 6–23)
CO2: 25 meq/L (ref 19–32)
Calcium: 9.3 mg/dL (ref 8.4–10.5)
Chloride: 104 meq/L (ref 96–112)
Creatinine, Ser: 1.14 mg/dL (ref 0.40–1.50)
GFR: 79.09 mL/min (ref >60.00)
Glucose, Bld: 107 mg/dL — ABNORMAL HIGH (ref 70–99)
Potassium: 4.3 meq/L (ref 3.5–5.1)
Sodium: 137 meq/L (ref 135–145)
Total Bilirubin: 0.6 mg/dL (ref 0.2–1.2)
Total Protein: 7.3 g/dL (ref 6.0–8.3)

## 2023-11-17 LAB — LIPID PANEL
Cholesterol: 150 mg/dL (ref 0–200)
HDL: 32.6 mg/dL — ABNORMAL LOW (ref >39.00)
LDL Cholesterol: 92 mg/dL (ref 0–99)
NonHDL: 116.98
Total CHOL/HDL Ratio: 5
Triglycerides: 125 mg/dL (ref 0.0–149.0)
VLDL: 25 mg/dL (ref 0.0–40.0)

## 2023-11-17 LAB — CBC
HCT: 48.4 % (ref 39.0–52.0)
Hemoglobin: 16.5 g/dL (ref 13.0–17.0)
MCHC: 34 g/dL (ref 30.0–36.0)
MCV: 88.3 fl (ref 78.0–100.0)
Platelets: 240 K/uL (ref 150.0–400.0)
RBC: 5.49 Mil/uL (ref 4.22–5.81)
RDW: 13.8 % (ref 11.5–15.5)
WBC: 5.8 K/uL (ref 4.0–10.5)

## 2023-11-17 LAB — HEMOGLOBIN A1C: Hgb A1c MFr Bld: 6.1 % (ref 4.6–6.5)

## 2023-11-17 MED ORDER — TIRZEPATIDE 10 MG/0.5ML ~~LOC~~ SOAJ
10.0000 mg | SUBCUTANEOUS | 5 refills | Status: AC
  Filled 2023-11-17: qty 2, 28d supply, fill #0
  Filled 2023-12-27: qty 2, 28d supply, fill #1
  Filled 2024-01-20: qty 2, 28d supply, fill #2
  Filled 2024-02-17: qty 2, 28d supply, fill #3

## 2023-11-17 NOTE — Assessment & Plan Note (Addendum)
 Blood pressure initially elevated today but at goal on recheck.  Patient does note that his pharmacy recently changed manufacturers and he has noted elevations last few days.  He is previously well-controlled on lisinopril  10 mg daily.  He will contact pharmacy to see if he can get back on the previous formulation.  He will let us  know if he needs any further assistance with this.  Will not make any medication changes today given his previous good control on lisinopril  10 mg daily.

## 2023-11-17 NOTE — Assessment & Plan Note (Signed)
 Check lipids with labs.

## 2023-11-17 NOTE — Assessment & Plan Note (Signed)
 Patient was referred for colonoscopy last year though would like to hold off on scheduling for now.  We discussed recommendations for early screening.

## 2023-11-17 NOTE — Progress Notes (Signed)
 Chief Complaint:  Kyle [name] is a 43 y.o. male who presents today for his annual comprehensive physical exam.    Assessment/Plan:  Chronic Problems Addressed Today: T2DM (type 2 diabetes mellitus) (HCC) Last A1c well-controlled 5.6.  Doing well with Mounjaro .  He is working on lifestyle interventions.  Will increase his Mounjaro  to 10 mg weekly.  Recheck A1c today.  Family history of colon cancer in mother Patient was referred for colonoscopy last year though would like to hold off on scheduling for now.  We discussed recommendations for early screening.  Dyslipidemia associated with type 2 diabetes mellitus (HCC) Check lipids with labs.  Hypertension associated with diabetes (HCC) Blood pressure initially elevated today but at goal on recheck.  Patient does note that his pharmacy recently changed manufacturers and he has noted elevations last few days.  He is previously well-controlled on lisinopril  10 mg daily.  He will contact pharmacy to see if he can get back on the previous formulation.  He will let us  know if he needs any further assistance with this.  Will not make any medication changes today given his previous good control on lisinopril  10 mg daily.  Preventative Healthcare: Check labs.  Flu vaccine given today.  Patient Counseling(The following topics were reviewed and/or handout was given):  -Nutrition: Stressed importance of moderation in sodium/caffeine intake, saturated fat and cholesterol, caloric balance, sufficient intake of fresh fruits, vegetables, and fiber.  -Stressed the importance of regular exercise.   -Substance Abuse: Discussed cessation/primary prevention of tobacco, alcohol, or other drug use; driving or other dangerous activities under the influence; availability of treatment for abuse.   -Injury prevention: Discussed safety belts, safety helmets, smoke detector, smoking near bedding or upholstery.   -Sexuality: Discussed sexually transmitted diseases,  partner selection, use of condoms, avoidance of unintended pregnancy and contraceptive alternatives.   -Dental health: Discussed importance of regular tooth brushing, flossing, and dental visits.  -Health maintenance and immunizations reviewed. Please refer to Health maintenance section.  Return to care in 1 year for next preventative visit.     Subjective:  HPI:  He has been wanting to get a flu shot no acute complaints today. Patient is here today for his annual physical.  See assessment / plan for status of chronic conditions.  Discussed the use of AI scribe software for clinical note transcription with the patient, who gave verbal consent to proceed.  History of Present Illness Kyle [name] is a 43 year old male who presents for an annual physical exam.  He is experiencing issues with his blood pressure medication, lisinopril , noting that his blood pressure was elevated today. He attributes this to a change in the medication's manufacturer. After starting the new medication on Saturday, he felt 'off' and suspected low blood sugar. He felt unwell again on Sunday after taking the medication but felt fine on Monday when he did not take it. He is seeking to resolve this issue with the pharmacy.  He is currently taking Mounjaro  for blood sugar management.  He maintains a healthy diet with plenty of fruits and vegetables and exercises regularly, going to the gym four times a week. His routine includes 25 to 40 minutes on the elliptical followed by weight training.  Family history is significant for colon cancer in his mother, who was diagnosed at age 73. He was referred for a colonoscopy last year but did not attend the appointment and has rescheduled it.     Lifestyle Diet: Balanced. Plenty of  fruits and vegetables.  Exercise: Gym 3-4 times per week.      11/17/2023    7:44 AM  Depression screen PHQ 2/9  Decreased Interest 0  Down, Depressed, Hopeless 0  PHQ - 2 Score 0     Health Maintenance Due  Topic Date Due   FOOT EXAM  Never done   COVID-19 Vaccine (4 - 2025-26 season) 09/14/2023   Diabetic kidney evaluation - eGFR measurement  11/13/2023     ROS: Per HPI, otherwise a complete review of systems was negative.   PMH:  The following were reviewed and entered/updated in epic: Past Medical History:  Diagnosis Date   Diabetes mellitus without complication (HCC)    Hypertension    Lumbar herniated disc    Pilonidal disease    Psoriasis    Sciatica 2017   Left   Wears contact lenses    Wears glasses    Patient Active Problem List   Diagnosis Date Noted   Family history of colon cancer in mother 11/13/2022   S/P cholecystectomy 11/13/2022   T2DM (type 2 diabetes mellitus) (HCC) 05/13/2022   Nephrolithiasis 05/13/2022   Dyslipidemia associated with type 2 diabetes mellitus (HCC) 05/13/2022   Sleep-disordered breathing 05/13/2022   Lumbar degenerative disc disease 04/05/2018   Hypertension associated with diabetes (HCC) 11/19/2015    Class: Chronic   Pilonidal disease s/p excision 12/04/2017 11/19/2015   Past Surgical History:  Procedure Laterality Date   ABCESS DRAINAGE     Pilonidal cyst   CHOLECYSTECTOMY N/A 08/30/2022   Procedure: LAPAROSCOPIC CHOLECYSTECTOMY;  Surgeon: Lyndel Deward PARAS, MD;  Location: [facility] OR;  Service: General;  Laterality: N/A;   LUMBAR LAMINECTOMY/DECOMPRESSION MICRODISCECTOMY Left 04/08/2018   Procedure: Left L5-S1 discectomy;  Surgeon: Burnetta Aures, MD;  Location: [facility] OR;  Service: Orthopedics;  Laterality: Left;  120 mins   PILONIDAL CYST EXCISION N/A 12/04/2017   Procedure: EXCISION OF PILONIDAL DISEASE;  Surgeon: Sheldon Standing, MD;  Location: [facility];  Service: General;  Laterality: N/A;   WISDOM TOOTH EXTRACTION      Family History  Problem Relation Age of Onset   Colon cancer Mother    Hypertension Father    Diabetes Father    Kidney cancer Father    Inflammatory bowel disease  Sister     Medications- reviewed and updated Current Outpatient Medications  Medication Sig Dispense Refill   lisinopril  (ZESTRIL ) 10 MG tablet Take 1 tablet (10 mg total) by mouth in the morning. 90 tablet 1   tirzepatide  (MOUNJARO ) 10 MG/0.5ML Pen Inject 10 mg into the skin once a week. 6 mL 5   No current facility-administered medications for this visit.    Allergies-reviewed and updated No Known Allergies  Social History   Socioeconomic History   Marital status: Married    Spouse name: Not on file   Number of children: Not on file   Years of education: Not on file   Highest education level: Some college, no degree  Occupational History   Not on file  Tobacco Use   Smoking status: Never   Smokeless tobacco: Never  Vaping Use   Vaping status: Never Used  Substance and Sexual Activity   Alcohol use: Not Currently    Comment: hardly ever beer every now and then   Drug use: No   Sexual activity: Not on file  Other Topics Concern   Not on file  Social History Narrative   ** Merged History Encounter **  Social Drivers of Corporate Investment Banker Strain: Low Risk  (09/09/2023)   Overall Financial Resource Strain (CARDIA)    Difficulty of Paying Living Expenses: Not hard at all  Food Insecurity: No Food Insecurity (09/09/2023)   Hunger Vital Sign    Worried About Running Out of Food in the Last Year: Never true    Ran Out of Food in the Last Year: Never true  Transportation Needs: No Transportation Needs (09/09/2023)   PRAPARE - Administrator, Civil Service (Medical): No    Lack of Transportation (Non-Medical): No  Physical Activity: Sufficiently Active (09/09/2023)   Exercise Vital Sign    Days of Exercise per Week: 3 days    Minutes of Exercise per Session: 50 min  Stress: No Stress Concern Present (09/09/2023)   [employer] of Occupational Health - Occupational Stress Questionnaire    Feeling of Stress: Not at all  Social Connections:  Socially Isolated (09/09/2023)   Social Connection and Isolation Panel    Frequency of Communication with Friends and Family: Once a week    Frequency of Social Gatherings with Friends and Family: Once a week    Attends Religious Services: Never    Database Administrator or Organizations: No    Attends Engineer, Structural: Not on file    Marital Status: Married        Objective:  Physical Exam: BP 120/80   Pulse 81   Temp (!) 97.2 F (36.2 C) (Temporal)   Ht 6' 3 (1.905 m)   Wt 286 lb 12.8 oz (130.1 kg)   SpO2 96%   BMI 35.85 kg/m   Body mass index is 35.85 kg/m. Wt Readings from Last 3 Encounters:  11/17/23 286 lb 12.8 oz (130.1 kg)  09/10/23 281 lb (127.5 kg)  05/13/23 288 lb 12.8 oz (131 kg)   Gen: NAD, resting comfortably HEENT: TMs normal bilaterally. OP clear. No thyromegaly noted.  CV: RRR with no murmurs appreciated Pulm: NWOB, CTAB with no crackles, wheezes, or rhonchi GI: Normal bowel sounds present. Soft, Nontender, Nondistended. MSK: no edema, cyanosis, or clubbing noted Skin: warm, dry Neuro: CN2-12 grossly intact. Strength 5/5 in upper and lower extremities. Reflexes symmetric and intact bilaterally.  Psych: Normal affect and thought content     Alixandria Friedt M. Kennyth, MD 11/17/2023 8:27 AM

## 2023-11-17 NOTE — Patient Instructions (Signed)
 It was very nice to see you today!  VISIT SUMMARY: During your annual physical exam, we discussed your blood pressure medication, diabetes management, and general health maintenance. You received a flu vaccine and we talked about your exercise and diet habits.  YOUR PLAN: TYPE 2 DIABETES MELLITUS: Your diabetes is managed with Mounjaro . We are increasing the dose to help with weight loss and blood sugar control. -Increase Mounjaro  to 10 mg subcutaneous weekly. -Order an A1c test.  HYPERTENSION ASSOCIATED WITH DIABETES: Your blood pressure is slightly elevated, possibly due to a change in the medication manufacturer. -Check your blood pressure regularly. -Contact the pharmacy to switch Lisinopril  manufacturer. -Monitor your blood pressure.  ADULT WELLNESS VISIT: You are maintaining a healthy diet and regular exercise routine. -Continue your current diet and exercise habits. -We performed a physical examination.  IMMUNIZATION: You received the flu vaccine today. -You are up to date on your pneumonia vaccine.  GENERAL HEALTH MAINTENANCE: We discussed colonoscopy screening due to your family history of colon cancer. -Discuss colonoscopy screening options.  Return in about 6 months (around 05/16/2024) for Follow Up.   Take care, Dr Kennyth  PLEASE NOTE:  If you had any lab tests, please let us  know if you have not heard back within a few days. You may see your results on mychart before we have a chance to review them but we will give you a call once they are reviewed by us .   If we ordered any referrals today, please let us  know if you have not heard from their office within the next week.   If you had any urgent prescriptions sent in today, please check with the pharmacy within an hour of our visit to make sure the prescription was transmitted appropriately.   Please try these tips to maintain a healthy lifestyle:  Eat at least 3 REAL meals and 1-2 snacks per day.  Aim for no more  than 5 hours between eating.  If you eat breakfast, please do so within one hour of getting up.   Each meal should contain half fruits/vegetables, one quarter protein, and one quarter carbs (no bigger than a computer mouse)  Cut down on sweet beverages. This includes juice, soda, and sweet tea.   Drink at least 1 glass of water with each meal and aim for at least 8 glasses per day  Exercise at least 150 minutes every week.    Preventive Care 30-45 Years Old, Male Preventive care refers to lifestyle choices and visits with your health care provider that can promote health and wellness. Preventive care visits are also called wellness exams. What can I expect for my preventive care visit? Counseling During your preventive care visit, your health care provider may ask about your: Medical history, including: Past medical problems. Family medical history. Current health, including: Emotional well-being. Home life and relationship well-being. Sexual activity. Lifestyle, including: Alcohol, nicotine or tobacco, and drug use. Access to firearms. Diet, exercise, and sleep habits. Safety issues such as seatbelt and bike helmet use. Sunscreen use. Work and work astronomer. Physical exam Your health care provider will check your: Height and weight. These may be used to calculate your BMI (body mass index). BMI is a measurement that tells if you are at a healthy weight. Waist circumference. This measures the distance around your waistline. This measurement also tells if you are at a healthy weight and may help predict your risk of certain diseases, such as type 2 diabetes and high blood pressure. Heart  rate and blood pressure. Body temperature. Skin for abnormal spots. What immunizations do I need?  Vaccines are usually given at various ages, according to a schedule. Your health care provider will recommend vaccines for you based on your age, medical history, and lifestyle or other factors,  such as travel or where you work. What tests do I need? Screening Your health care provider may recommend screening tests for certain conditions. This may include: Lipid and cholesterol levels. Diabetes screening. This is done by checking your blood sugar (glucose) after you have not eaten for a while (fasting). Hepatitis B test. Hepatitis C test. HIV (human immunodeficiency virus) test. STI (sexually transmitted infection) testing, if you are at risk. Lung cancer screening. Prostate cancer screening. Colorectal cancer screening. Talk with your health care provider about your test results, treatment options, and if necessary, the need for more tests. Follow these instructions at home: Eating and drinking  Eat a diet that includes fresh fruits and vegetables, whole grains, lean protein, and low-fat dairy products. Take vitamin and mineral supplements as recommended by your health care provider. Do not drink alcohol if your health care provider tells you not to drink. If you drink alcohol: Limit how much you have to 0-2 drinks a day. Know how much alcohol is in your drink. In the [place] one drink equals one 12 oz bottle of beer (355 mL), one 5 oz glass of wine (148 mL), or one 1 oz glass of hard liquor (44 mL). Lifestyle Brush your teeth every morning and night with fluoride toothpaste. Floss one time each day. Exercise for at least 30 minutes 5 or more days each week. Do not use any products that contain nicotine or tobacco. These products include cigarettes, chewing tobacco, and vaping devices, such as e-cigarettes. If you need help quitting, ask your health care provider. Do not use drugs. If you are sexually active, practice safe sex. Use a condom or other form of protection to prevent STIs. Take aspirin only as told by your health care provider. Make sure that you understand how much to take and what form to take. Work with your health care provider to find out whether it is safe and  beneficial for you to take aspirin daily. Find healthy ways to manage stress, such as: Meditation, yoga, or listening to music. Journaling. Talking to a trusted person. Spending time with friends and family. Minimize exposure to UV radiation to reduce your risk of skin cancer. Safety Always wear your seat belt while driving or riding in a vehicle. Do not drive: If you have been drinking alcohol. Do not ride with someone who has been drinking. When you are tired or distracted. While texting. If you have been using any mind-altering substances or drugs. Wear a helmet and other protective equipment during sports activities. If you have firearms in your house, make sure you follow all gun safety procedures. What's next? Go to your health care provider once a year for an annual wellness visit. Ask your health care provider how often you should have your eyes and teeth checked. Stay up to date on all vaccines. This information is not intended to replace advice given to you by your health care provider. Make sure you discuss any questions you have with your health care provider. Document Revised: 06/27/2020 Document Reviewed: 06/27/2020 Elsevier Patient Education  2024 [employer].

## 2023-11-17 NOTE — Assessment & Plan Note (Signed)
 Last A1c well-controlled 5.6.  Doing well with Mounjaro .  He is working on lifestyle interventions.  Will increase his Mounjaro  to 10 mg weekly.  Recheck A1c today.

## 2023-11-18 LAB — TSH: TSH: 1.17 u[IU]/mL (ref 0.35–5.50)

## 2023-11-20 ENCOUNTER — Ambulatory Visit: Payer: Self-pay | Admitting: Family Medicine

## 2023-11-20 NOTE — Progress Notes (Signed)
 His A1c is up a little since last time but still at goal.   His cholesterol is better than last year.  All his other labs are stable.  His A1c should improve with the increased Mounjaro  dose.  Do not need to make any other adjustments to his treatment plan at this time.  We should recheck his A1c at 6 months.  We can recheck everything else next year.

## 2023-12-03 ENCOUNTER — Other Ambulatory Visit: Payer: Self-pay

## 2023-12-19 ENCOUNTER — Other Ambulatory Visit (HOSPITAL_BASED_OUTPATIENT_CLINIC_OR_DEPARTMENT_OTHER): Payer: Self-pay

## 2023-12-19 ENCOUNTER — Other Ambulatory Visit (HOSPITAL_COMMUNITY): Payer: Self-pay

## 2024-01-11 ENCOUNTER — Other Ambulatory Visit (HOSPITAL_BASED_OUTPATIENT_CLINIC_OR_DEPARTMENT_OTHER): Payer: Self-pay

## 2024-01-11 LAB — OPHTHALMOLOGY REPORT-SCANNED

## 2024-01-11 MED ORDER — FLUOROMETHOLONE 0.1 % OP SUSP
1.0000 [drp] | Freq: Four times a day (QID) | OPHTHALMIC | 0 refills | Status: AC
  Filled 2024-01-11: qty 5, 25d supply, fill #0

## 2024-01-16 ENCOUNTER — Other Ambulatory Visit: Payer: Self-pay | Admitting: Family Medicine

## 2024-01-16 ENCOUNTER — Other Ambulatory Visit (HOSPITAL_BASED_OUTPATIENT_CLINIC_OR_DEPARTMENT_OTHER): Payer: Self-pay

## 2024-01-18 ENCOUNTER — Other Ambulatory Visit (HOSPITAL_BASED_OUTPATIENT_CLINIC_OR_DEPARTMENT_OTHER): Payer: Self-pay

## 2024-01-18 ENCOUNTER — Other Ambulatory Visit: Payer: Self-pay

## 2024-01-18 MED ORDER — LISINOPRIL 10 MG PO TABS
10.0000 mg | ORAL_TABLET | Freq: Every morning | ORAL | 1 refills | Status: AC
  Filled 2024-01-18: qty 90, 90d supply, fill #0

## 2024-01-22 ENCOUNTER — Other Ambulatory Visit (HOSPITAL_COMMUNITY): Payer: Self-pay

## 2024-01-28 ENCOUNTER — Other Ambulatory Visit: Payer: Self-pay | Admitting: Medical Genetics

## 2024-01-28 DIAGNOSIS — Z006 Encounter for examination for normal comparison and control in clinical research program: Secondary | ICD-10-CM

## 2024-02-01 ENCOUNTER — Other Ambulatory Visit: Payer: Self-pay

## 2024-02-14 ENCOUNTER — Other Ambulatory Visit (HOSPITAL_BASED_OUTPATIENT_CLINIC_OR_DEPARTMENT_OTHER): Payer: Self-pay

## 2024-02-17 ENCOUNTER — Other Ambulatory Visit (HOSPITAL_COMMUNITY): Payer: Self-pay

## 2024-05-17 ENCOUNTER — Ambulatory Visit: Admitting: Family Medicine

## 2024-11-22 ENCOUNTER — Encounter: Admitting: Family Medicine
